# Patient Record
Sex: Female | Born: 1990 | Race: Black or African American | Hispanic: No | Marital: Single
Health system: Midwestern US, Community
[De-identification: ages and names within clinical notes are randomized; demographics above are authoritative.]

## PROBLEM LIST (undated history)

## (undated) DIAGNOSIS — O9989 Other specified diseases and conditions complicating pregnancy, childbirth and the puerperium: Secondary | ICD-10-CM

## (undated) DIAGNOSIS — O4690 Antepartum hemorrhage, unspecified, unspecified trimester: Secondary | ICD-10-CM

---

## 2012-08-17 LAB — RPR
RPR, EXTERNAL: NONREACTIVE
RPR, External: NONREACTIVE

## 2012-08-17 LAB — HIV-1 AB WESTERN BLOT CONFIRM ONLY
HIV, EXTERNAL: NONREACTIVE
HIV, External: NONREACTIVE

## 2012-08-17 LAB — BLOOD TYPE, (ABO+RH)
ABO,Rh: O NEG
TYPE, ABO & RH, EXTERNAL: O NEG

## 2012-08-17 LAB — N GONORRHOEAE, DNA PROBE: Gonorrhea, External: NEGATIVE

## 2012-08-17 LAB — ANTIBODY SCREEN: Antibody screen, External: NEGATIVE

## 2012-08-17 LAB — HEPATITIS B SURFACE AG W/REFLEX: HBsAg, External: NEGATIVE

## 2012-08-17 LAB — RUBELLA AB, IGM: Rubella, External: IMMUNE

## 2012-08-17 LAB — CHLAMYDIA DNA PROBE: Chlamydia, External: NEGATIVE

## 2012-11-19 ENCOUNTER — Inpatient Hospital Stay: Payer: MEDICAID

## 2012-11-19 LAB — URINALYSIS W/ REFLEX CULTURE
Bacteria: NEGATIVE /hpf
Bilirubin: NEGATIVE
Blood: NEGATIVE
Glucose: NEGATIVE mg/dL
Ketone: NEGATIVE mg/dL
Leukocyte Esterase: NEGATIVE
Nitrites: NEGATIVE
Protein: NEGATIVE mg/dL
Specific gravity: 1.02 (ref 1.003–1.030)
Urobilinogen: 0.2 EU/dL (ref 0.2–1.0)
pH (UA): 7 (ref 5.0–8.0)

## 2012-11-19 LAB — CBC W/O DIFF
HCT: 32.6 % — ABNORMAL LOW (ref 35.0–47.0)
HGB: 10.5 g/dL — ABNORMAL LOW (ref 11.5–16.0)
MCH: 28 PG (ref 26.0–34.0)
MCHC: 32.2 g/dL (ref 30.0–36.5)
MCV: 86.9 FL (ref 80.0–99.0)
PLATELET: 177 10*3/uL (ref 150–400)
RBC: 3.75 M/uL — ABNORMAL LOW (ref 3.80–5.20)
RDW: 13.4 % (ref 11.5–14.5)
WBC: 11.2 10*3/uL — ABNORMAL HIGH (ref 3.6–11.0)

## 2012-11-19 LAB — METABOLIC PANEL, COMPREHENSIVE
A-G Ratio: 1 — ABNORMAL LOW (ref 1.1–2.2)
ALT (SGPT): 27 U/L (ref 12–78)
AST (SGOT): 18 U/L (ref 15–37)
Albumin: 3.2 g/dL — ABNORMAL LOW (ref 3.5–5.0)
Alk. phosphatase: 62 U/L (ref 50–136)
Anion gap: 4 mmol/L — ABNORMAL LOW (ref 5–15)
BUN/Creatinine ratio: 12 (ref 12–20)
BUN: 6 MG/DL (ref 6–20)
Bilirubin, total: 0.2 MG/DL (ref 0.2–1.0)
CO2: 26 mmol/L (ref 21–32)
Calcium: 8.9 MG/DL (ref 8.5–10.1)
Chloride: 108 mmol/L (ref 97–108)
Creatinine: 0.52 MG/DL (ref 0.45–1.15)
GFR est AA: >60 mL/min/{1.73_m2} (ref >60)
GFR est non-AA: >60 mL/min/{1.73_m2} (ref >60)
Globulin: 3.3 g/dL (ref 2.0–4.0)
Glucose: 109 mg/dL — ABNORMAL HIGH (ref 65–100)
Potassium: 3.9 mmol/L (ref 3.5–5.1)
Protein, total: 6.5 g/dL (ref 6.4–8.2)
Sodium: 138 mmol/L (ref 136–145)

## 2012-11-19 MED ADMIN — ondansetron (ZOFRAN) injection 4 mg: INTRAVENOUS | @ 19:00:00 | NDC 00409475503

## 2012-11-19 MED ADMIN — dextrose 5% lactated ringers infusion: INTRAVENOUS | @ 19:00:00 | NDC 00338012504

## 2012-11-19 MED ADMIN — promethazine (PHENERGAN) 12.5 mg in 0.9% sodium chloride 50 mL IVPB: INTRAVENOUS | @ 21:00:00 | NDC 00641092821

## 2012-11-19 MED ADMIN — sodium chloride (NS) 0.9 % flush: @ 19:00:00 | NDC 87701099893

## 2012-11-19 MED ADMIN — famotidine (PF) (PEPCID) injection 20 mg: INTRAVENOUS | @ 19:00:00 | NDC 63323073912

## 2012-11-19 MED ADMIN — dextrose 5% lactated ringers infusion: INTRAVENOUS | @ 19:00:00 | NDC 00409792909

## 2012-11-19 NOTE — Progress Notes (Addendum)
1331-pt here from ER with nausea and hx of vomitting/diarrhea (last time this am), pt reports abdominal and back aches and feels baby moving like normal  1350-pt resting in bed  1400-called Dr Thalia Bloodgood to alert her of pts arrival and status

## 2012-11-19 NOTE — H&P (Signed)
History & Physical    Name: Sharon Simmons MRN: [id number]  SSN: [id number]    Date of Birth: Jan 04, 1991  Age: 22 y.o.  Sex: female      Subjective:     Reason for Admission:  Pregnancy and N/V/D    History of Present Illness: Sharon Simmons is a 22 y.o. African American female with an estimated gestational age of [redacted]w[redacted]d with Estimated Date of Delivery: 03/23/13. Patient complains of nausea/vomiting and diarrhea that started last pm. for 1 days. 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 . Patient denies contractions, vaginal bleeding  and vaginal leaking of fluid . Reports fetal movement.     OB History    Grav Para Term Preterm Abortions TAB SAB Ect Mult Living    1                # Outc Date GA Lbr Len/2nd Wgt Sex Del Anes PTL Lv    1 CUR                 Past Medical History   Diagnosis Date   ??? Anemia      hx of   ??? Psychiatric problem      anxiety, no meds     Past Surgical History   Procedure Laterality Date   ??? Hx other surgical  2012     wisdom teeth     Social History     Occupational History   ??? Not on file.     Social History Main Topics   ??? Smoking status: Former Smoker     Quit date: 06/13/2012   ??? Smokeless tobacco: Not on file   ??? Alcohol Use: No   ??? Drug Use: No   ??? Sexually Active: Yes -- Female partner(s)     Birth Control/ Protection: Implant      History reviewed. No pertinent family history.    No Known Allergies  Prior to Admission medications    Medication Sig Start Date End Date Taking? Authorizing Provider   PRENATAL VIT/IRON FUMARATE/FA (PRENATAL PO) Take  by mouth.   Yes Historical Provider        Review of Systems:  Pertinent items are noted in the History of Present Illness.     Objective:     Vitals:    Filed Vitals:    11/19/12 1338 11/19/12 1344   BP: 116/65    Pulse: 79    Temp: 98 ??F (36.7 ??C)    Resp: 12    Height:  5\' 8"  (1.727 m)   Weight:  74.39 kg (164 lb)      Temp (24hrs), Avg:98 ??F (36.7 ??C), Min:98 ??F (36.7 ??C), Max:98 ??F (36.7 ??C)    BP  Min:  116/65  Max: 116/65     Physical Exam:  Patient without distress.  Heart: Regular rate and rhythm  Lung: clear to auscultation throughout lung fields, no wheezes, no rales, no rhonchi and normal respiratory effort  Abdomen: soft, nontender, gravid  Lower Extremities:  - Edema No     Membranes:  Intact  Uterine Activity:  None  Fetal Heart Rate:  +fht's by doppler       Lab/Data Review:  No results found for this or any previous visit (from the past 24 hour(s)).    Assessment and Plan:     22 y/o G1 at 13 2/7weeks here for n/v/d-likely viral gastroenteritis. Will send cmp, cbc and ua. Start ivf's/zofran/famotidine. Re-evaluate  after labs return.     Signed By:  Gabriel Earing, DO     November 19, 2012

## 2012-11-19 NOTE — Progress Notes (Signed)
High Risk Obstetrics Progress Note    Name: Sharon Simmons MRN: [id number]  SSN: [id number]    Date of Birth: 10/08/1990  Age: 22 y.o.  Sex: female      Subjective:      LOS: 0 days    Estimated Date of Delivery: 03/23/13   Gestational Age Today: [redacted]w[redacted]d     Patient admitted for n/v/d. States she does have mild left round ligament discomfort and does not have any further vomiting or diarrhea. She has tolerated crackers and water. Ready to go home. .    Objective:     Vitals:  Blood pressure 116/65, pulse 79, temperature 98 ??F (36.7 ??C), resp. rate 12, height 5\' 8"  (1.727 m), weight 74.39 kg (164 lb).   Temp (24hrs), Avg:98 ??F (36.7 ??C), Min:98 ??F (36.7 ??C), Max:98 ??F (36.7 ??C)    Systolic (24hrs), Avg:116 mmHg, Min:116 mmHg, Max:116 mmHg     Diastolic (24hrs), Avg:65 mmHg, Min:65 mmHg, Max:65 mmHg       Intake and Output:          Physical Exam:  Patient without distress.       Membranes:  Intact    Uterine Activity:  None         Labs:   Recent Results (from the past 36 hour(s))   URINALYSIS W/ REFLEX CULTURE    Collection Time     11/19/12  2:48 PM       Result Value Range    Color YELLOW/STRAW      Appearance CLOUDY (*) CLEAR    Specific gravity 1.020  1.003 - 1.030      pH (UA) 7.0  5.0 - 8.0      Protein NEGATIVE   NEGATIVE mg/dL    Glucose NEGATIVE   NEGATIVE mg/dL    Ketone NEGATIVE   NEGATIVE mg/dL    Bilirubin NEGATIVE   NEGATIVE    Blood NEGATIVE   NEGATIVE    Urobilinogen 0.2  0.2 - 1.0 EU/dL    Nitrites NEGATIVE   NEGATIVE    Leukocyte Esterase NEGATIVE   NEGATIVE    UA:UC IF INDICATED CULTURE NOT INDICATED BY UA RESULT  CULTURE NOT INDICATE    WBC 0-4  0 - 4 /hpf    RBC 0-5  0 - 5 /hpf    Epithelial cells FEW  FEW /lpf    Bacteria NEGATIVE   NEGATIVE /hpf    Hyaline Cast 0-2  0 - 2   CBC W/O DIFF    Collection Time     11/19/12  3:00 PM       Result Value Range    WBC 11.2 (*) 3.6 - 11.0 K/uL    RBC 3.75 (*) 3.80 - 5.20 M/uL    HGB 10.5 (*) 11.5 - 16.0 g/dL    HCT 13.0 (*) 86.5 - 47.0 %    MCV 86.9   80.0 - 99.0 FL    MCH 28.0  26.0 - 34.0 PG    MCHC 32.2  30.0 - 36.5 g/dL    RDW 78.4  69.6 - 29.5 %    PLATELET 177  150 - 400 K/uL   METABOLIC PANEL, COMPREHENSIVE    Collection Time     11/19/12  3:00 PM       Result Value Range    Sodium 138  136 - 145 mmol/L    Potassium 3.9  3.5 - 5.1 mmol/L    Chloride 108  97 - 108 mmol/L  CO2 26  21 - 32 mmol/L    Anion gap 4 (*) 5 - 15 mmol/L    Glucose 109 (*) 65 - 100 mg/dL    BUN 6  6 - 20 MG/DL    Creatinine 1.61  0.96 - 1.15 MG/DL    BUN/Creatinine ratio 12  12 - 20      GFR est AA >60  >60 ml/min/1.64m2    GFR est non-AA >60  >60 ml/min/1.58m2    Calcium 8.9  8.5 - 10.1 MG/DL    Bilirubin, total 0.2  0.2 - 1.0 MG/DL    ALT 27  12 - 78 U/L    AST 18  15 - 37 U/L    Alk. phosphatase 62  50 - 136 U/L    Protein, total 6.5  6.4 - 8.2 g/dL    Albumin 3.2 (*) 3.5 - 5.0 g/dL    Globulin 3.3  2.0 - 4.0 g/dL    A-G Ratio 1.0 (*) 1.1 - 2.2         Assessment and Plan:      Active Problems:    * No active hospital problems. *     22 y/o G1 with gastroenteritis -resolving. Tolerating po. Will discharge home with script for phenergan. Keep follow up appt as scheduled or sooner prn.     Signed By: Gabriel Earing, DO     November 19, 2012

## 2012-11-19 NOTE — Progress Notes (Addendum)
1430 Spoke to Dr. Thalia Bloodgood on phone.  Updated her on HPI, VS, FHTs, and uterine irritability (10-20 secs duration).  Orders for 500 ml D5LR bolus followed by 125 ml/hr x 1 liter, cbc, cmp, ua, and to call her when lab results are available.  1455 Dr. Thalia Bloodgood at bedside, discussing HPI with pt, performing PE.  Updated that pt had an episode of emesis after two sips of ginger ale and that pt would like something through her IV for heartburn.  Orders received for IV Pepcid.  Will call to updated with lab results.  Plan to d/c pt home with antiemetics once tolerating PO fluids.  1610 Called Dr. Thalia Bloodgood to update her on lab results and that patient continues to have episode of emesis (despite Zofran) after having only one sip of ginger ale and 2 small bites of applesauce.  States she will call right back.  1634 Spoke to Dr. Thalia Bloodgood on phone.  Updated her on lab results and continued episodes of emesis.  Orders to continue fluids, give a dose of phenergan, and she will be by to see the patient.  1654 Phenergan hung, pt trying applesauce now at her own discretion.  1720 Pt finished 8 graham crackers, applesauce, and a can of ginger ale.  Asking for more to eat, states she is feeling better and she is hungry.  Also states that she is having some stomach pain and points to her left lower abdomen/groin.  Toco adj.  Pt taken graham crackers, peanut butter, and apple juice.  1750 Spoke to Dr. Thalia Bloodgood, updated her that pt was tolerating PO fluids, crackers, and applesauce.  States she will be in to see pt.  1752 Pt taken a pitcher of water and encouraged to PO hydrate in anticipation of d/c home.  1800 Dr. Thalia Bloodgood at beside assessing and discussing plan of care with pt.  1845 Discharge instructions given including prescription for phenergan, pt verbalized understanding, pt d/c home.

## 2013-02-05 ENCOUNTER — Inpatient Hospital Stay: Payer: MEDICAID

## 2013-02-05 LAB — URINALYSIS W/ REFLEX CULTURE
Bacteria: NEGATIVE /hpf
Bilirubin: NEGATIVE
Blood: NEGATIVE
Glucose: NEGATIVE mg/dL
Ketone: NEGATIVE mg/dL
Leukocyte Esterase: NEGATIVE
Nitrites: NEGATIVE
Protein: NEGATIVE mg/dL
Specific gravity: 1.014 (ref 1.003–1.030)
Urobilinogen: 1 EU/dL (ref 0.2–1.0)
pH (UA): 7 (ref 5.0–8.0)

## 2013-02-05 NOTE — Progress Notes (Addendum)
1618: Dr Thalia Bloodgood called to inform her of patient arrival. S  1619: Called Dr. Romeo Rabon to see if she is covering   1622: Dr Shela Nevin paged.

## 2013-02-05 NOTE — H&P (Signed)
History & Physical    Name: Sharon Simmons MRN: [id number]  SSN: [id number]    Date of Birth: 22-Dec-1990  Age: 22 y.o.  Sex: female        Subjective:pelvic pain     Estimated Date of Delivery: 03/23/13  OB History    Grav Para Term Preterm Abortions TAB SAB Ect Mult Living    1                   Ms. Sharon Simmons is admitted with pregnancy at [redacted]w[redacted]d for pelvic pain. Prenatal course was normal. Please see prenatal records for details.  Pt describes pain as a sharp pain near groin/vagina which occurs with position changes or lifting leg, etc.  When questioned further, she does have some mild menstrual-like cramping.  No unusual activity recently. No VB or LOF.  No recent IC.  No urinary sxs    Past Medical History   Diagnosis Date   ??? Psychiatric problem      anxiety, no meds   ??? Anemia    also Wolff-Parkinson-White Syndrome    Past Surgical History   Procedure Laterality Date   ??? Hx other surgical  2012     wisdom teeth     Social History     Occupational History   ??? Not on file.     Social History Main Topics   ??? Smoking status: Former Smoker     Quit date: 06/13/2012   ??? Smokeless tobacco: Not on file   ??? Alcohol Use: No   ??? Drug Use: No   ??? Sexually Active: Yes -- Female partner(s)     Birth Control/ Protection: Implant     History reviewed. No pertinent family history.    No Known Allergies  Prior to Admission medications    Medication Sig Start Date End Date Taking? Authorizing Provider   PRENATAL VIT/IRON FUMARATE/FA (PRENATAL PO) Take  by mouth.   Yes Historical Provider        Review of Systems: Pertinent items are noted in HPI.    Objective:     Vitals:  Filed Vitals:    02/05/13 1552 02/05/13 1553 02/05/13 1631   BP: 119/63  116/70   Pulse: 79  82   Temp: 98.3 ??F (36.8 ??C)     Resp: 16     Height:  5\' 8"  (1.727 m)    Weight:  83.008 kg (183 lb)         Physical Exam:  Patient without distress.  Abdomen: soft, nontender  Fundus: soft and non tender  Perineum: blood absent, amniotic fluid absent  Cervical Exam: 0  cm dilated    0% effaced    -2 station    Presenting Part: cephalic  Lower Extremities:  - Edema No  Membranes:  Intact  Fetal Heart Rate: Reactive  Uterine contractions: irregular, rare  Uterine irritability: yes    Prenatal Labs:   No results found for this basename: OBEXTABORH, OBExtAbscrn, OBExtRubella, OBExtGrBS, OBExtHBsAg, OBExtHIV, OBExtRPR, OBExtGonorr, OBExtChlam        Assessment/Plan:     Plan: No cervical change or evidence of labor. Some mild uterine irritability and musculoskeletal pain.  Precautions discussed.  Keep appt in office next week or call prn.    Signed By:  Marcia Brash, MD     February 05, 2013

## 2013-02-05 NOTE — Progress Notes (Addendum)
1610. Bedside report received from e. Konrad Dolores, rn.  [phone]. Dr. Shela Nevin on phone. Dr. Shela Nevin given pt update of pelvic pressure/pain, uterine iritability, and a couple of contractions since on the monitor. Dr. Shela Nevin will come evaluate patient. PO hydrate in meantime.  1740. SVE by Dr. Shela Nevin. Pt cervix closed. Dr. Shela Nevin discharging patient.  1815. Pt discharged home. Pt signed discharge orders and states understanding. Pt given paper copy of instructions and understands when to call for immediate care. Pt will follow up on 02/18/13 at 3:15pm for routine appointment.

## 2013-02-25 LAB — GYN RAPID GP B STREP: GrBStrep, External: NEGATIVE

## 2013-02-26 ENCOUNTER — Inpatient Hospital Stay: Payer: MEDICAID

## 2013-02-26 LAB — URINALYSIS W/ REFLEX CULTURE
Bacteria: NEGATIVE /hpf
Bilirubin: NEGATIVE
Blood: NEGATIVE
Glucose: NEGATIVE mg/dL
Ketone: NEGATIVE mg/dL
Leukocyte Esterase: NEGATIVE
Nitrites: NEGATIVE
Protein: NEGATIVE mg/dL
Specific gravity: 1.007 (ref 1.003–1.030)
Urobilinogen: 0.2 EU/dL (ref 0.2–1.0)
pH (UA): 6.5 (ref 5.0–8.0)

## 2013-02-26 NOTE — Progress Notes (Addendum)
1731: Patient arrived to LDR 12 via wheelchair from ED with c/o bleeding. Patient states she began with "bright read bleeding" around 1pm and passing small "dimed-sized" clots around 2pm.  1748: Called Dr Montel Culver and informed her of patients arrival and complaints. Dr Montel Culver states she reviewed the patient's tracing on [employer]. Orders received for PO hydration and SVE.   1843: UA sent.  1940: Bedside and Verbal shift change report given to Luvenia Heller, RNC (oncoming nurse) by Delton See, RN (offgoing nurse). Report given with SBAR, Intake/Output and Recent Results.

## 2013-02-26 NOTE — H&P (Signed)
History & Physical    Name: Sharon Simmons MRN: [id number]  SSN: [id number]    Date of Birth: [birth year]  Age: 22 y.o.  Sex: female        Subjective:     Estimated Date of Delivery: 03/23/13  OB History    Grav Para Term Preterm Abortions TAB SAB Ect Mult Living    1                # Outc Date GA Lbr Len/2nd Wgt Sex Del Anes PTL Lv    1 CUR                   Ms. Jonas is admitted with pregnancy at [redacted]w[redacted]d for c/o VB and ctx. Prenatal course was complicated by maternal Wolff-Parkinson-White Syndrome and Rh neg s/p Rhogam. Please see prenatal records for details.  Pt was seen in office yesterday with SVE by Dr. Thalia Bloodgood 1/50.    Past Medical History   Diagnosis Date   ??? Psychiatric problem      anxiety, no meds   ??? Heart abnormality      Wolff-Parkinson White Syndrome   ??? 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 Anemia      Past Surgical History   Procedure Laterality Date   ??? Hx other surgical  2012     wisdom teeth   ??? Hx heart catheterization  2012     d/t Evelene Croon Parkinson White Syndrome     Social History     Occupational History   ??? Not on file.     Social History Main Topics   ??? Smoking status: Former Smoker     Quit date: 06/13/2012   ??? Smokeless tobacco: Not on file   ??? Alcohol Use: No   ??? Drug Use: No   ??? Sexually Active: Yes -- Female partner(s)     Birth Control/ Protection: Implant     Family History   Problem Relation Age of Onset   ??? Alcohol abuse Neg Hx    ??? Arthritis-osteo Neg Hx    ??? Asthma Neg Hx    ??? Bleeding Prob Neg Hx    ??? Cancer Neg Hx    ??? Diabetes Neg Hx    ??? Elevated Lipids Neg Hx    ??? Headache Neg Hx    ??? Heart Disease Neg Hx    ??? Hypertension Neg Hx    ??? Migraines Neg Hx    ??? Lung Disease Neg Hx    ??? Psychiatric Disorder Neg Hx    ??? Stroke Neg Hx    ??? Mental Retardation Neg Hx        No Known Allergies  Prior to Admission medications    Medication Sig Start Date End Date Taking? Authorizing Provider   PRENATAL VIT/IRON FUMARATE/FA (PRENATAL PO) Take  by  mouth.    Historical Provider        Review of Systems: A comprehensive review of systems was negative except for that written in the HPI.    Objective:     Vitals:  Filed Vitals:    02/26/13 1740   Height: 5\' 8"  (1.727 m)   Weight: 86.637 kg (191 lb)        Physical Exam:  Patient without distress.  Membranes:  Intact  Fetal Heart Rate: Reactive   SVE (per RN): 1/40    Prenatal Labs:   Lab Results   Component Value Date/Time  ABO,Rh O negative 08/17/2012    Rubella immune 08/17/2012    HBsAg negative 08/17/2012    HIV non reactive 08/17/2012    RPR non reactive 08/17/2012    Gonorrhea negative 08/17/2012    Chlamydia negative 08/17/2012         Assessment/Plan:     Plan: Pt still having occ ctx on toco but more comfortable s/p PO hydration. Has eaten since she came in. No VB seen, even with SVE. Reviewed tips for comfort - PO hydration, therapeutic rest with Tylenol and Benadryl to help her sleep tonight.  D/c home. F/up as scheduled with Dr. Thalia Bloodgood.  Precautions reviewed.    Signed By:  Otho Ket, MD     February 26, 2013

## 2013-03-17 ENCOUNTER — Inpatient Hospital Stay
Admit: 2013-03-17 | Discharge: 2013-03-20 | Disposition: A | Discharge status: Home / Self Care | Payer: MEDICAID | Attending: Obstetrics & Gynecology | Admitting: Obstetrics & Gynecology

## 2013-03-17 LAB — CBC WITH AUTOMATED DIFF
ABS. BASOPHILS: 0 10*3/uL (ref 0.0–0.1)
ABS. EOSINOPHILS: 0.1 10*3/uL (ref 0.0–0.4)
ABS. LYMPHOCYTES: 1.2 10*3/uL (ref 0.8–3.5)
ABS. MONOCYTES: 0.8 10*3/uL (ref 0.0–1.0)
ABS. NEUTROPHILS: 7.4 10*3/uL (ref 1.8–8.0)
BASOPHILS: 0 % (ref 0–1)
EOSINOPHILS: 1 % (ref 0–7)
HCT: 33.6 % — ABNORMAL LOW (ref 35.0–47.0)
HGB: 11.1 g/dL — ABNORMAL LOW (ref 11.5–16.0)
LYMPHOCYTES: 13 % (ref 12–49)
MCH: 26 PG (ref 26.0–34.0)
MCHC: 33 g/dL (ref 30.0–36.5)
MCV: 78.7 FL — ABNORMAL LOW (ref 80.0–99.0)
MONOCYTES: 8 % (ref 5–13)
NEUTROPHILS: 78 % — ABNORMAL HIGH (ref 32–75)
PLATELET: 184 10*3/uL (ref 150–400)
RBC: 4.27 M/uL (ref 3.80–5.20)
RDW: 16.4 % — ABNORMAL HIGH (ref 11.5–14.5)
WBC: 9.5 10*3/uL (ref 3.6–11.0)

## 2013-03-17 LAB — CBC W/O DIFF
HCT: 33.8 % — ABNORMAL LOW (ref 35.0–47.0)
HGB: 11 g/dL — ABNORMAL LOW (ref 11.5–16.0)
MCH: 25.7 PG — ABNORMAL LOW (ref 26.0–34.0)
MCHC: 32.5 g/dL (ref 30.0–36.5)
MCV: 79 FL — ABNORMAL LOW (ref 80.0–99.0)
PLATELET: 180 10*3/uL (ref 150–400)
RBC: 4.28 M/uL (ref 3.80–5.20)
RDW: 16.1 % — ABNORMAL HIGH (ref 11.5–14.5)
WBC: 9.6 10*3/uL (ref 3.6–11.0)

## 2013-03-17 MED ADMIN — fentaNYL-bupivacaine in NS(PF) 2-0.125 mcg/mL-% epidural infusion: EPIDURAL | @ 19:00:00 | NDC 61553012448

## 2013-03-17 MED ADMIN — bupivacaine (PF) (MARCAINE) 0.5 % (5 mg/mL) injection: EPIDURAL | @ 19:00:00 | NDC 63323046637

## 2013-03-17 MED ADMIN — lactated ringers infusion: INTRAVENOUS | @ 18:00:00 | NDC 00409795309

## 2013-03-17 MED ADMIN — fentanyl citrate (PF) injection: EPIDURAL | @ 19:00:00 | NDC 00409909332

## 2013-03-17 MED ADMIN — lactated ringers infusion: INTRAVENOUS | @ 22:00:00 | NDC 00409795309

## 2013-03-17 MED ADMIN — lactated ringers bolus infusion 500 mL: INTRAVENOUS | @ 17:00:00 | NDC 00409795309

## 2013-03-17 MED ADMIN — lactated ringers infusion: INTRAVENOUS | @ 23:00:00 | NDC 00409795309

## 2013-03-17 MED ADMIN — lactated ringers infusion: INTRAVENOUS | @ 20:00:00 | NDC 00409795309

## 2013-03-17 MED ADMIN — terbutaline (BRETHINE) 1 mg/mL injection: SUBCUTANEOUS | @ 20:00:00 | NDC 63323066501

## 2013-03-17 NOTE — Anesthesia Procedure Notes (Signed)
Epidural Block    Staffing  Anesthesiologist: Lydia Meng  Additional Notes  Labor Epidural catheter placement Procedure Note    Epidural analgesia requested.  All questions answered regarding epidural placement.  Patient wishes to proceed.  "Time-out" performed  Patient was placed in the seated position.   The back was prepped and draped at the lumbar region.   Lidocaine 1% x 2ml was injected locally at ~L3 - L4.  A 17G Tuohy needle was placed at the above level.   Loss of resistance was obtained, no CSF or paresthesia was apparent.   A 19 G epidural catheter was placed and secured at ~7 cm at the skin.   Aspiration was negative. No paresthesias noted.  A test dose of local anesthetic as charted  was negative for heart rate change, tinnitus, metallic taste or mental status changes.   Catheter was secured with Tegaderm and tape.   Constant infusion w/ patient controlled bolus as ordered was started by RN.  No apparent complications.  Vital signs were stable and the patient reported relief.

## 2013-03-17 NOTE — Progress Notes (Signed)
Labor Progress Note  Patient seen, fetal heart rate and contraction pattern evaluated, patient examined. Pt feeling more rectal pressure.  No data found.      Physical Exam:  Cervical Exam:  6/90 %/0/   Uterine Activity: Frequency: Every 1.5-2 minutes  Fetal Heart Rate: Baseline: 130 per minute  Variability: moderate  Decelerations: variable    Assessment/Plan:  Labor  Not progressing normally  continue pitocin augmentation  titrating dosage safely, Continue plan for vaginal delivery. Small amount of change since last exam. Discussed with pt her course of labor. Will allow for one more hour of adequate contractions and if no change, will proceed with c-section.     Lance Morin, DO

## 2013-03-17 NOTE — Progress Notes (Signed)
Labor Progress Note  Patient seen, fetal heart rate and contraction pattern evaluated, patient examined.  Patient Vitals for the past 1 hrs:   BP Pulse   03/17/13 1548 113/60 mmHg 59       Physical Exam:  Cervical Exam:  4/80/-1  Membranes:  Prior ROM  Uterine Activity: Tachysystole  Fetal Heart Rate: 130s minimal - moderate, no accels, 2 minute decel to 70s with cervical exam with recorvery back to baseline with variability    Assessment/Plan:  Latent labor - not progressing.  Terbutaline administered for dysfunctional/tachysystole ctx pattern.  FHT improved.

## 2013-03-17 NOTE — Progress Notes (Signed)
Labor Progress Note  Patient seen, fetal heart rate and contraction pattern evaluated, patient examined.  Patient Vitals for the past 1 hrs:   BP Pulse   03/17/13 1502 123/65 mmHg 67   03/17/13 1456 124/64 mmHg 70   03/17/13 1451 121/56 mmHg 77   03/17/13 1446 128/75 mmHg 104   03/17/13 1445 130/94 mmHg 109       Physical Exam:  Cervical Exam:  4/80/-1 FSE & IUPC placed  Membranes:  Prior AROM  Uterine Activity: CTX every 2  Fetal Heart Rate: 150s min-mod no accels no decels    Assessment/Plan:  [redacted]w[redacted]d latent labor - now has epidural.  FSE & IUPC placed for closer monitoring.  FWB reassuring however still not reactive.  Continue expectant management at this time.

## 2013-03-17 NOTE — Progress Notes (Signed)
Labor Progress Note  Patient seen, fetal heart rate and contraction pattern evaluated, patient examined.  C/o CTX increasing - requests epidural.   Patient Vitals for the past 1 hrs:   BP Pulse   03/17/13 1352 127/65 mmHg 74       Physical Exam:  Cervical Exam:  4/90/-1  Membranes:  Incidental AROM at time of cervical exam - clear  Uterine Activity: CTX every 2-3  Fetal Heart Rate: 150s min-mod, no 15x15 accels, no decel    Assessment/Plan:  G1P0 at [redacted]w[redacted]d in latent labor.  Epidural.  FWB reassuring but yet to be reactive.  GBS neg.

## 2013-03-17 NOTE — Progress Notes (Signed)
Labor Progress Note  Patient seen, fetal heart rate and contraction pattern evaluated, patient examined. Pt still comfortable with epidural.  Patient Vitals for the past 2 hrs:   BP Temp   03/17/13 2143 117/61 mmHg 97.9 ??F (36.6 ??C)       Physical Exam:  Cervical Exam:  6-7/90 %/0/  (minimal change)  Uterine Activity: Frequency: Every 1.5-2 minutes and Intensity: strong  Fetal Heart Rate: Baseline: 150 per minute  Variability: moderate  Decelerations: variable    Assessment/Plan:  Labor not progressing. Only approximately 2cm of change over the past 7 hours. Now with rise in baseline to 150s. Still with frequent contractions and unable to tolerate an increase in Pitocin. Proceed with Cesarean Section Reassuring fetal status with labor not progressing normally, findings consistent with failure of dilatation.  Recommended proceeding with cesarean delivery.  Risks of bleeding, infection, bladder and bowel damage explained to patient and father of baby.  They understand the situation and consent to the cesarean delivery. 2gm of Ancef to be given preop. DVT prophylaxis.          Lance Morin, DO

## 2013-03-17 NOTE — Progress Notes (Addendum)
03/17/2013 12:52 PM for uterine contractions and rule out SROM     1325 Dr. Margaretha Sheffield notified of TOCO and FHR tracing post giving juice. Ok to give bolus     1750 Discussed FHR and TOCO tracing with Dr. Romeo Rabon and [facility] nurse Darla. Discussed possibility of augmenting with pitocin to enhance MVU's and encourage consistent pattern. Will continue to monitor for now and give pt more time with Dr. Romeo Rabon to re-evaluate.    1930 Bedside and Verbal shift change report given to C. DeChristopher RN  (Cabin crew) by R.Hagler RN  Physiological scientist). Report included the following information SBAR, Procedure Summary, Intake/Output, MAR, Accordion and Recent Results.

## 2013-03-17 NOTE — Anesthesia Pre-Procedure Evaluation (Addendum)
Anesthetic History   No history of anesthetic complications           Review of Systems / Medical History  Patient summary reviewed, nursing notes reviewed and pertinent labs reviewed    Pulmonary  Within defined limits               Neuro/Psych   Within defined limits           Cardiovascular  Within defined limits                   GI/Hepatic/Renal  Within defined limits                Endo/Other  Within defined limits           Other Findings            Physical Exam    Airway  Mallampati: II  TM Distance: > 6 cm  Neck ROM: normal range of motion   Mouth opening: Normal     Cardiovascular  Regular rate and rhythm,  S1 and S2 normal,  no murmur, click, rub, or gallop             Dental  No notable dental hx       Pulmonary  Breath sounds clear to auscultation               Abdominal  GI exam deferred       Other Findings            Anesthetic Plan    ASA: 2  Anesthesia type: epidural          Induction: Intravenous  Anesthetic plan and risks discussed with: Patient

## 2013-03-17 NOTE — H&P (Addendum)
History & Physical    Name: Sharon Simmons MRN: [id number]  SSN: [id number]    Date of Birth: 17-Oct-1990  Age: 22 y.o.  Sex: female        Subjective:     Estimated Date of Delivery: 03/23/13  OB History    Grav Para Term Preterm Abortions TAB SAB Ect Mult Living    1                # Outc Date GA Lbr Len/2nd Wgt Sex Del Anes PTL Lv    1 CUR                   Sharon Simmons is admitted with pregnancy at [redacted]w[redacted]d for early labor.  Pt reports contractions since last night.  Possible leakage of clear fluid today.  No VB. Prenatal course was notable for h/o Wolf-Parkinson-White syndrome s/p normal cards eval.  Rh negative s/p rhogam.  Received tdap. Please see prenatal records for details.    Past Medical History   Diagnosis Date   ??? Psychiatric problem      anxiety, no meds   ??? Heart abnormality      Wolff-Parkinson White Syndrome   ??? 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 Anemia      Past Surgical History   Procedure Laterality Date   ??? Hx other surgical  2012     wisdom teeth   ??? Hx heart catheterization  2012     d/t Sharon Simmons Parkinson White Syndrome     Social History     Occupational History   ??? Not on file.     Social History Main Topics   ??? Smoking status: Former Smoker     Quit date: 06/13/2012   ??? Smokeless tobacco: Not on file   ??? Alcohol Use: No   ??? Drug Use: No   ??? Sexually Active: Yes -- Female partner(s)     Birth Control/ Protection: Implant     Family History   Problem Relation Age of Onset   ??? Alcohol abuse Neg Hx    ??? Arthritis-osteo Neg Hx    ??? Asthma Neg Hx    ??? Bleeding Prob Neg Hx    ??? Cancer Neg Hx    ??? Diabetes Neg Hx    ??? Elevated Lipids Neg Hx    ??? Headache Neg Hx    ??? Heart Disease Neg Hx    ??? Hypertension Neg Hx    ??? Migraines Neg Hx    ??? Lung Disease Neg Hx    ??? Psychiatric Disorder Neg Hx    ??? Stroke Neg Hx    ??? Mental Retardation Neg Hx        No Known Allergies  Prior to Admission medications    Medication Sig Start Date End Date Taking? Authorizing Provider    PRENATAL VIT/IRON FUMARATE/FA (PRENATAL PO) Take  by mouth.    Historical Provider        Review of Systems: A comprehensive review of systems was negative except for that written in the HPI.    Objective:     Vitals:  141/80, 127/65, HR 73, afebrile    Physical Exam:  Patient without distress.  Heart: Regular rate and rhythm  Lung: clear to auscultation throughout lung fields, no wheezes, no rales, no rhonchi and normal respiratory effort  Abdomen: soft, nontender  Fundus: soft and non tender, EFW 6.5#  Perineum: blood absent,  amniotic fluid absent  Cervical Exam: 3/80/-1 vertex  Lower Extremities:  - Edema No  Membranes:    Fetal Heart Rate: 150s minimal, no accels, no decels  Toco: CTX every 6 minutes    Prenatal Labs:   Lab Results   Component Value Date/Time    ABO,Rh O negative 08/17/2012    Rubella immune 08/17/2012    HBsAg negative 08/17/2012    HIV non reactive 08/17/2012    RPR non reactive 08/17/2012    Gonorrhea negative 08/17/2012    Chlamydia negative 08/17/2012   GBS negative (02/25/13)    Nitrazine equivocal    Assessment/Plan:     Plan: Admit for early labor.  Reassuring FHT - category II.  Will encourage ambulation once reactive NST.  Group B Strep was negative.    Signed By:  Geri Seminole, MD     March 17, 2013

## 2013-03-17 NOTE — Progress Notes (Signed)
Labor Progress Note  Patient seen, fetal heart rate and contraction pattern evaluated, patient examined. Pt now having variable decels. Pitocin was titrated up to 4 units.  Patient Vitals for the past 2 hrs:   BP Temp Pulse Resp   03/17/13 1959 126/78 mmHg 98.4 ??F (36.9 ??C) 62 16       Physical Exam:  Cervical Exam:  4 (by Dr.Golish)/90 %/0/  (exam 4-5 cm- minimal change)  Uterine Activity: Frequency: Every 1.5-2 minutes  Fetal Heart Rate: Baseline: 130 per minute  Variability: moderate  Decelerations: variable    Assessment/Plan:  Reassuring fetal status, Labor  Not progressing normally  continue pitocin augmentation  titrating dosage safely. Suspect OP presentation of the fetal head with Leopolds. Will attempt "spinning baby" maneuvers with bedside nurse. Discussed again with pt the possible need for a c-section due to failure to dilate and changes in fetal heart rate. Will continue to monitor closely.    Lance Morin, DO

## 2013-03-17 NOTE — Progress Notes (Signed)
Labor Progress Note  Patient seen, fetal heart rate and contraction pattern evaluated, patient examined.  Patient Vitals for the past 2 hrs:   BP Temp   03/17/13 1856 115/58 mmHg -   03/17/13 1812 130/56 mmHg 97.8 ??F (36.6 ??C)       Physical Exam:  Cervical Exam:  4/90 %/0/   Uterine Activity: Frequency: Every 1.5-3 minutes and Intensity: moderate  Fetal Heart Rate: Baseline: 130 per minute  Variability: moderate    Assessment/Plan:  Labor  Not progressing normally  start pitocin augmentation. Minimal cervical change since the time of admission. IUPC in place to monitor uterine contractions and FSE. Contractions are frequent but not adequate- last MVU recording was 85.  Fetal heart tracing is reassuring, but not reactive. Discussed with pt and at this time will give small amount of augmentation with Pitocin in attempts to get pt into a better contraction pattern that is more adequate. Also discussed with pt the possibility of need for c-section if still no cervical change or fetal intolerance of labor. Pt and her significant other voice understanding.     Lance Morin, DO

## 2013-03-17 NOTE — Anesthesia Procedure Notes (Signed)
Epidural Block    Staffing  Anesthesiologist: Sarah-Jane Nazario  Additional Notes  Labor Epidural catheter placement Procedure Note    Epidural analgesia requested.  All questions answered regarding epidural placement.  Patient wishes to proceed.  "Time-out" performed  Patient was placed in the seated position.   The back was prepped and draped at the lumbar region.   Lidocaine 1% x 2ml was injected locally at ~L3 - L4.  A 17G Tuohy needle was placed at the above level.   Loss of resistance was obtained, no CSF or paresthesia was apparent.   A 19 G epidural catheter was placed and secured at ~7 cm at the skin.   Aspiration was negative. No paresthesias noted.  A test dose of local anesthetic as charted  was negative for heart rate change, tinnitus, metallic taste or mental status changes.   Catheter was secured with Tegaderm and tape.   Constant infusion w/ patient controlled bolus as ordered was started by RN.  No apparent complications.  Vital signs were stable and the patient reported relief.

## 2013-03-18 LAB — CBC WITH AUTOMATED DIFF
ABS. BASOPHILS: 0 10*3/uL (ref 0.0–0.1)
ABS. EOSINOPHILS: 0 10*3/uL (ref 0.0–0.4)
ABS. LYMPHOCYTES: 0.9 10*3/uL (ref 0.8–3.5)
ABS. MONOCYTES: 0.9 10*3/uL (ref 0.0–1.0)
ABS. NEUTROPHILS: 13.1 10*3/uL — ABNORMAL HIGH (ref 1.8–8.0)
BASOPHILS: 0 % (ref 0–1)
EOSINOPHILS: 0 % (ref 0–7)
HCT: 28 % — ABNORMAL LOW (ref 35.0–47.0)
HGB: 9 g/dL — ABNORMAL LOW (ref 11.5–16.0)
LYMPHOCYTES: 6 % — ABNORMAL LOW (ref 12–49)
MCH: 25.6 PG — ABNORMAL LOW (ref 26.0–34.0)
MCHC: 32.1 g/dL (ref 30.0–36.5)
MCV: 79.5 FL — ABNORMAL LOW (ref 80.0–99.0)
MONOCYTES: 6 % (ref 5–13)
NEUTROPHILS: 88 % — ABNORMAL HIGH (ref 32–75)
PLATELET: 161 10*3/uL (ref 150–400)
RBC: 3.52 M/uL — ABNORMAL LOW (ref 3.80–5.20)
RDW: 16 % — ABNORMAL HIGH (ref 11.5–14.5)
WBC: 14.8 10*3/uL — ABNORMAL HIGH (ref 3.6–11.0)

## 2013-03-18 MED ADMIN — acetaminophen (OFIRMEV) infusion 1,000 mg: INTRAVENOUS | @ 05:00:00 | NDC 43825010201

## 2013-03-18 MED ADMIN — diphenhydrAMINE (BENADRYL) injection 12.5 mg: INTRAVENOUS | @ 09:00:00 | NDC 00641037621

## 2013-03-18 MED ADMIN — oxytocin (PITOCIN) 30 units/500 mL D5W: INTRAVENOUS | @ 01:00:00 | NDC 09999289830

## 2013-03-18 MED ADMIN — lidocaine-EPINEPHrine (PF) (XYLOCAINE) 2 %-1:200,000 injection: EPIDURAL | @ 03:00:00 | NDC 00409318301

## 2013-03-18 MED ADMIN — ondansetron (ZOFRAN) injection 4 mg: INTRAVENOUS | @ 07:00:00 | NDC 00143989105

## 2013-03-18 MED ADMIN — sodium chloride (NS) flush 5-10 mL: INTRAVENOUS | @ 03:00:00 | NDC 87701099893

## 2013-03-18 MED ADMIN — ceFAZolin in 0.9% NS (ANCEF) IVPB soln 2 g: INTRAVENOUS | @ 04:00:00 | NDC 09999966850

## 2013-03-18 MED ADMIN — ketorolac (TORADOL) injection 30 mg: INTRAVENOUS | @ 13:00:00 | NDC 00409379501

## 2013-03-18 MED ADMIN — lactated ringers infusion: INTRAVENOUS | @ 09:00:00 | NDC 00409795309

## 2013-03-18 MED ADMIN — oxytocin (PITOCIN) 30 units/500 mL D5W: INTRAVENOUS | NDC 09999289830

## 2013-03-18 MED ADMIN — ketorolac (TORADOL) injection 30 mg: INTRAVENOUS | @ 07:00:00 | NDC 00409379501

## 2013-03-18 MED ADMIN — lidocaine-EPINEPHrine (PF) (XYLOCAINE) 2 %-1:200,000 injection: @ 04:00:00 | NDC 58768034410

## 2013-03-18 MED ADMIN — ibuprofen (MOTRIN) tablet 800 mg: ORAL | @ 05:00:00 | NDC 76420057590

## 2013-03-18 MED ADMIN — oxytocin in lactated ringers 20 unit/1,000 mL infusion soln: @ 04:00:00 | NDC 53191040901

## 2013-03-18 MED ADMIN — oxytocin (PITOCIN) 20 units/1000 ml LR: INTRAVENOUS | @ 04:00:00 | NDC 99990002896

## 2013-03-18 MED ADMIN — morphine (pf) (DURAMORPH;ASTRAMORPH) 0.5 mg/mL injection: EPIDURAL | @ 04:00:00 | NDC 60977001602

## 2013-03-18 MED ADMIN — ketorolac (TORADOL) injection 30 mg: INTRAVENOUS | @ 19:00:00 | NDC 00409379501

## 2013-03-18 MED ADMIN — oxytocin (PITOCIN) 30 units/500 mL D5W: INTRAVENOUS | @ 02:00:00 | NDC 09999289830

## 2013-03-18 MED ADMIN — diphenhydrAMINE (BENADRYL) injection 12.5 mg: INTRAVENOUS | @ 13:00:00 | NDC 63323066401

## 2013-03-18 NOTE — Op Note (Signed)
Cesarean Section Operative Report       Patient: Sharon Simmons MRN: [id number]  SSN: [id number]    Date of Birth: [birth year]  Age: 22 y.o.  Sex: female       Date of Procedure: 03/17/2013     Preoperative Diagnosis: failure to progress, non reassuring FHR tracing    Postoperative Diagnosis: same as pre-op but delivered    Procedure: Low Transverse Procedure(s):  CESAREAN SECTION    Surgeon(s):  Lance Morin, DO  Assistant: RN on duty    Anesthesia: Epidural    Estimated Blood Loss :  800    Findings:   Information for the patient's newborn:  Iasia, Forcier Female A [[id number]]   Delivery of a  Female [1] infant on 03/17/2013 at 11:58 PM. Apgars were 1 and 7.    Umbilical Cord: 3 Vessels    Umbilical Cord Events: None    Placenta: Expressed removal with Intact appearance.    Amniotic Fluid Volume:  Moderate     Amniotic Fluid Description:  Clear;Blood stained         Specimens: * No specimens in log *            Complications:  none    Procedure Details:    After informed consent was obtained, the patient was taken to the operating room, where Epidural anesthesia was administered and found to be adequate. 2 gm of Ancef was given preop. Foley catheter had been placed in L&D room using sterile technique. The patient was prepped and draped in the usual sterile fashion.  After testing for adequate anesthesia, a Pfannenstiel incision was made and carried to the anterior rectus fascia which was nicked in the midline.  This incision in the fascia was extended laterally with curved Mayo Scissors.  The rectus muscles were separated from the fascial sheath with curved mayo scissors and blunt dissection.  The muscles were then parted in the midline, the peritoneum was entered bluntly and stretched.   The bladder blade was then inserted. The vesicouterine peritoneum was identified and entered sharply with Metzenbaum scissors. The bladder flap was then created sharply and the bladder blade was reinserted. A low transverse uterine  incision was made with the scalpel and extended laterally with blunt finger dissection. Amniotomy was performed. The baby???s head was then delivered atraumatically followed by the remainder of the body. The nose and mouth were suctioned. The cord was clamped and cut and the baby was handed off to the waiting  staff. The placenta was then extracted from the uterus. The uterus was exteriorized and cleared of all clots and debris using a moist lap sponge. The uterine incision was closed with a 0 Monocryl in a running locking fashion with a second layer used to imbricate the incision. Two separate figure of eight sutures were used for hemostasis. The posterior cul-de-sac was irrigated with warm normal saline.  Both lateral gutters were irrigated with warm normal saline. Due to slight oozing near the bladder flap, Arista was used for hemostasis. Good hemostasis was again reassured throughout, including the hysterotomy, bladder flap, rectus muscles and posterior surface of the fascia.  Seprafilm was placed over the hysterotomy and along the anterior aspect of the uterus. The fascia was closed with 0 Vicryl in a running fashion. Good hemostasis was assured. The subcuticular layers were reapproximated with a 3-0 Vicryl in running fashion. The skin was closed with staples. The patient tolerated the procedure well. Sponge, lap, and needle counts were correct times three and  the patient and baby were taken to recovery/postpartum room in stable condition.    Signed By: Lance Morin, DO     March 18, 2013

## 2013-03-18 NOTE — Other (Addendum)
Bedside shift change report given to R. Lape, RN (Cabin crew) by Judie Petit. Effie Shy, RN (offgoing nurse). Report included the following information SBAR, Kardex, Procedure Summary, Intake/Output, MAR and Recent Results.     Hourly rounds completed 2000-0000  Hourly rounds completed 0000-0300      0400- Bedside shift change report given to R. Lape, RN (Cabin crew) by Kirtland Bouchard. Sangpeal, RN Physiological scientist). Report included the following information SBAR, Kardex, Procedure Summary, Intake/Output, MAR and Recent Results.     Patient needs rhogam before discharge. Please order. Thank you!    Hourly rounds completed 0400-0800

## 2013-03-18 NOTE — Progress Notes (Addendum)
1230 Patient ambulating to bathroom with assistance. Steady gait. Foley removed. Due to void by 1830.    1630 patient up to bathroom with minimal assistance. Steady gait. Voided large amount without difficulty. Peri Care education complete. Due to void complete. Check void by 2230.    1845 Patient up to bathroom with standby assistance. Steady gait. Voided large amount without difficulty. Small amount of bleeding. Check void complete.

## 2013-03-18 NOTE — Progress Notes (Signed)
Post-Operative Day Number 1 Progress Note    Patient doing well post-op day 1 from cesarean delivery without significant complaints.  Pain controlled on current medication.  Voiding without difficulty, normal lochia. Denies cp/sob/n/v. No flatus yet.     Vitals:  Patient Vitals for the past 8 hrs:   BP Temp Pulse Resp SpO2   03/18/13 0430 130/73 mmHg 98.9 ??F (37.2 ??C) 75 18 -   03/18/13 0300 134/69 mmHg 98.3 ??F (36.8 ??C) 81 18 -   03/18/13 0235 133/69 mmHg 100 ??F (37.8 ??C) 101 - -   03/18/13 0220 136/61 mmHg - 100 - -   03/18/13 0205 134/71 mmHg - 110 - 97 %   03/18/13 0150 118/67 mmHg - 119 - 99 %   03/18/13 0134 134/81 mmHg - 121 - 99 %   03/18/13 0119 137/85 mmHg - 123 - -   03/18/13 0104 139/85 mmHg - 123 - 99 %   03/18/13 0050 151/71 mmHg 99.1 ??F (37.3 ??C) 143 18 99 %     Temp (24hrs), Avg:98.5 ??F (36.9 ??C), Min:97.8 ??F (36.6 ??C), Max:100 ??F (37.8 ??C)      Vital signs stable, afebrile.    Exam:  Patient without distress.   Heart regular rate and rhythm   Lungs CTA b/l               Abdomen soft, fundus firm at level of umbilicus, appropriately tender, hypoactive bowel sounds, mild distension.                 Bandage dry and clean.               Lower extremities are negative for swelling, cords or tenderness.    Lab/Data Review:  CBC:   Lab Results   Component Value Date/Time    WBC 14.8* 03/18/2013  7:00 AM    HGB 9.0* 03/18/2013  7:00 AM    HCT 28.0* 03/18/2013  7:00 AM    PLT 161 03/18/2013  7:00 AM       Assessment and Plan:  Patient appears to be having uncomplicated post-cesarean course.  Continue routine post-op care and maternal education.  Girl. Rh negative-rhogam if indicated. RI.

## 2013-03-18 NOTE — Progress Notes (Signed)
03/18/2013  10:43 AM    Patient status post Procedure(s):  CESAREAN SECTION on 03/17/2013 under epidural with duramorph.  BP 126/69   Pulse 67   Temp(Src) 37 ??C (98.6 ??F)   Resp 18   Ht 5\' 8"  (1.727 m)   Wt 88.451 kg (195 lb)   BMI 29.66 kg/m2   SpO2 97%   Breastfeeding? Yes  Patient doing relatively well.     moderate itching.  Pain is mild .  mild nausea and/or vomiting.  Urinary: foley draining  PO intake: good  Block site reveals normal exam; no erythema, swelling or tenderness.  No complications.  Continue current postop pain regimen.      Thane Edu, MD    Arlyss Gandy

## 2013-03-18 NOTE — Other (Signed)
Hourly rounds made from 0700-0800

## 2013-03-18 NOTE — Progress Notes (Signed)
Hourly rounds made from 0300-0700

## 2013-03-18 NOTE — Op Note (Signed)
Cesarean Section Operative Report       Patient: Sharon Simmons MRN: [id number]  SSN: [id number]    Date of Birth: 03/17/1991  Age: 22 y.o.  Sex: female       Date of Procedure: 03/17/2013     Preoperative Diagnosis: failure to progress, non reassuring FHR tracing    Postoperative Diagnosis: same as pre-op but delivered    Procedure: Low Transverse Procedure(s):  CESAREAN SECTION    Surgeon(s):  Orian Figueira T Golish, DO  Assistant: RN on duty    Anesthesia: Epidural    Estimated Blood Loss :  800    Findings:   Information for the patient's newborn:  Laymon, Female A [[id number]]   Delivery of a  Female [1] infant on 03/17/2013 at 11:58 PM. Apgars were 1 and 7.    Umbilical Cord: 3 Vessels    Umbilical Cord Events: None    Placenta: Expressed removal with Intact appearance.    Amniotic Fluid Volume:  Moderate     Amniotic Fluid Description:  Clear;Blood stained         Specimens: * No specimens in log *            Complications:  none    Procedure Details:    After informed consent was obtained, the patient was taken to the operating room, where Epidural anesthesia was administered and found to be adequate. 2 gm of Ancef was given preop. Foley catheter had been placed in L&D room using sterile technique. The patient was prepped and draped in the usual sterile fashion.  After testing for adequate anesthesia, a Pfannenstiel incision was made and carried to the anterior rectus fascia which was nicked in the midline.  This incision in the fascia was extended laterally with curved Mayo Scissors.  The rectus muscles were separated from the fascial sheath with curved mayo scissors and blunt dissection.  The muscles were then parted in the midline, the peritoneum was entered bluntly and stretched.   The bladder blade was then inserted. The vesicouterine peritoneum was identified and entered sharply with Metzenbaum scissors. The bladder flap was then created sharply and the bladder blade was reinserted. A low transverse uterine  incision was made with the scalpel and extended laterally with blunt finger dissection. Amniotomy was performed. The baby???s head was then delivered atraumatically followed by the remainder of the body. The nose and mouth were suctioned. The cord was clamped and cut and the baby was handed off to the waiting  staff. The placenta was then extracted from the uterus. The uterus was exteriorized and cleared of all clots and debris using a moist lap sponge. The uterine incision was closed with a 0 Monocryl in a running locking fashion with a second layer used to imbricate the incision. Two separate figure of eight sutures were used for hemostasis. The posterior cul-de-sac was irrigated with warm normal saline.  Both lateral gutters were irrigated with warm normal saline. Due to slight oozing near the bladder flap, Arista was used for hemostasis. Good hemostasis was again reassured throughout, including the hysterotomy, bladder flap, rectus muscles and posterior surface of the fascia.  Seprafilm was placed over the hysterotomy and along the anterior aspect of the uterus. The fascia was closed with 0 Vicryl in a running fashion. Good hemostasis was assured. The subcuticular layers were reapproximated with a 3-0 Vicryl in running fashion. The skin was closed with staples. The patient tolerated the procedure well. Sponge, lap, and needle counts were correct times three and   the patient and baby were taken to recovery/postpartum room in stable condition.    Signed By: Tyshauna Finkbiner T Golish, DO     March 18, 2013

## 2013-03-18 NOTE — Progress Notes (Signed)
TRANSFER - IN REPORT:    Verbal report received from carter dechristopher rn(name) on [name]  being received from labor and delivery(unit) for routine progression of care      Report consisted of patient???s Situation, Background, Assessment and   Recommendations(SBAR).     Information from the following report(s) SBAR, OR Summary, Procedure Summary, Intake/Output, MAR and Recent Results was reviewed with the receiving nurse.    Opportunity for questions and clarification was provided.      Assessment completed upon patient???s arrival to unit and care assumed.

## 2013-03-18 NOTE — Other (Addendum)
Bedside shift change report given to M. Effie Shy, RN (oncoming nurse) by I. Durwin Nora, RN Physiological scientist). Report included the following information SBAR.     0800-1200 Hourly rounds complete  1200-1600 Hourly rounds complete  1600-2000 Hourly rounds complete

## 2013-03-18 NOTE — Progress Notes (Signed)
NUTRITION       Nutrition screening referral was triggered based on results obtained during nursing admission assessment.  The patient's chart was reviewed and nutrition assessment is not indicated at this time.  Plan to see patient for rescreen as indicated.  Thank you.     EMILY L KLINE, RD

## 2013-03-18 NOTE — Anesthesia Post-Procedure Evaluation (Signed)
Post-Anesthesia Evaluation and Assessment    Patient: Sharon Simmons MRN: [id number]  SSN: [id number]    Date of Birth: 09/23/1990  Age: 21 y.o.  Sex: female       Cardiovascular Function/Vital Signs  Visit Vitals   Item Reading   ??? BP 137/85   ??? Pulse 123   ??? Temp 37.3 ??C (99.1 ??F)   ??? Resp 18   ??? Ht 5\' 8"  (1.727 m)   ??? Wt 88.451 kg (195 lb)   ??? BMI 29.66 kg/m2   ??? SpO2 99%   ??? Breastfeeding No       Patient is status post epidural anesthesia for Procedure(s):  CESAREAN SECTION.    Nausea/Vomiting: None    Postoperative hydration reviewed and adequate.    Pain:  Pain Scale 1: Labor Algorithm/Pain Intensity (03/17/13 2200)  Pain Intensity 1: 0 (03/17/13 1348)   Managed    Neurological Status:   Neuro (WDL): Within Defined Limits (03/17/13 1307)   At baseline    Mental Status and Level of Consciousness: Alert and oriented     Pulmonary Status:   O2 Device: Non-rebreather mask (Room air ) (03/17/13 1924)   Adequate oxygenation and airway patent    Complications related to anesthesia: None    Post-anesthesia assessment completed. No concerns    Signed By: Vanessa Ralphs, MD     March 18, 2013

## 2013-03-18 NOTE — Progress Notes (Addendum)
1935: Bedside shift change report given to C DeChristopher RN (oncoming nurse) by Milas Gain RN (offgoing nurse). Report included the following information SBAR, Intake/Output, MAR and Recent Results.     2000: pitocin started per Dr. Romeo Rabon    2052: SVE by Dr. Romeo Rabon 5/90/0    2057: side lying pelvic release on R side x 3 contractions    2100: side lying pelvic release on L side x3 contractions    2138: pitocin decreased d/t tachysystole    2155: pt feeling constant rectal pressure. SVE by Dr. Romeo Rabon 6/90/0. Will recheck in 1 hour    2245: pitcoin stopped d/t tachysystole and absent resting tone    2308: SVE 6-7/90/0. c/s called at this time d/t failure to progress and non reassuring fetal HR tracing    2316: Dr. Nonah Mattes at bedside to dose epidural    2334: rolled into OR 1 for c/s. Apgars were 1 & 7 - NICU called emergently for assistance at 1 minute of life.    2358: delivery of viable baby girl via LTCS.    5284: pt returned to L&D room 4 for routine post op care    0050: MEWS score of 4 noted - pt very anxious and tachycardic. MD aware - will reassess and cont to monitor    0250: SBAR OUT Report: Mother    Verbal report given to Idell Pickles RN (full name & credentials) on this patient, who is now being transferred to MIU (unit) for routine progression of care.       Report consisted of patient's Situation, Background, Assessment and Recommendations (SBAR).       Newborn ID bands were compared with the identification form, and verified with the patient and receiving nurse.    Information from the SBAR, Intake/Output, MAR and Recent Results and the [facility] Report was reviewed with the receiving nurse; opportunity for questions and clarification provided.

## 2013-03-19 MED ADMIN — oxycodone-acetaminophen (PERCOCET) 5-325 mg per tablet 1 tablet: ORAL | @ 18:00:00 | NDC 68084035511

## 2013-03-19 MED ADMIN — ibuprofen (MOTRIN) tablet 800 mg: ORAL | @ 08:00:00 | NDC 68084065811

## 2013-03-19 MED ADMIN — sodium chloride (NS) flush 5-10 mL: INTRAVENOUS | @ 02:00:00 | NDC 87701099893

## 2013-03-19 MED ADMIN — ibuprofen (MOTRIN) tablet 800 mg: ORAL | @ 18:00:00 | NDC 76420057590

## 2013-03-19 MED ADMIN — ibuprofen (MOTRIN) tablet 800 mg: ORAL | @ 16:00:00 | NDC 68084065811

## 2013-03-19 MED ADMIN — ketorolac (TORADOL) injection 30 mg: INTRAVENOUS | @ 02:00:00 | NDC 00409379501

## 2013-03-19 MED ADMIN — oxycodone-acetaminophen (PERCOCET) 5-325 mg per tablet 1 tablet: ORAL | NDC 00406051223

## 2013-03-19 NOTE — Progress Notes (Signed)
Post-Operative Day Number 2 Progress Note    Patient doing well post-op day 2 from cesarean delivery without significant complaints.  Pain controlled on current medication.  Voiding without difficulty, normal lochia. No flatus yet. Denies cp/sob/n/v.    Vitals:  Patient Vitals for the past 8 hrs:   BP Temp Pulse Resp   03/19/13 0400 134/90 mmHg 98.7 ??F (37.1 ??C) 106 18     Temp (24hrs), Avg:98.6 ??F (37 ??C), Min:98.3 ??F (36.8 ??C), Max:98.8 ??F (37.1 ??C)      Vital signs stable, afebrile.    Exam:  Patient without distress.   Heart regular rate and rhythm   Lungs CTA b/l               Abdomen soft, fundus firm at level of umbilicus, non tender.                Incision dry and clean without erythema. Staples intact               Lower extremities are negative for swelling, cords or tenderness.    Lab/Data Review:  no new labs.    Assessment and Plan:  Patient appears to be having uncomplicated post-cesarean course.  Continue routine post-op care and maternal education.  Girl. Rh negative-baby rh pos-needs rhogam. RI. Plan d/c tomorrow with follow up in office in 1-2 weeks for pp depression check.

## 2013-03-19 NOTE — Progress Notes (Addendum)
Received OB SBAR report from De Burrs, RN at the bedside.    0800-1200 Hourly rounds completed    1200-1600 Hourly rounds comleted

## 2013-03-19 NOTE — Other (Addendum)
Bedside shift change report given to R. Lape, RN (oncoming nurse) by S. Filegar, RN (offgoing nurse). Report included the following information SBAR, Kardex, Procedure Summary, Intake/Output, MAR and Recent Results.     Hourly rounds completed 2000-0000

## 2013-03-19 NOTE — Other (Addendum)
Shift report received from Britnei Stevens RN using sbar format  Hourly rounds completed 1600-2000

## 2013-03-19 NOTE — Other (Addendum)
0315: OB SBAR report received by Rachel Lape RN.   0400: OB SBAR report given to Rachel Lape RN.  Hourly rounds completed 0315-0400.

## 2013-03-20 LAB — RH IMMUNE GLOBULIN EVAL-LAB ORDER
ABO/Rh(D): O NEG
Fetal screen: NEGATIVE
Status of unit: TRANSFUSED
Unit division: 0
WEAK D: NEGATIVE

## 2013-03-20 MED ADMIN — oxycodone-acetaminophen (PERCOCET) 5-325 mg per tablet 2 tablet: ORAL | @ 05:00:00 | NDC 00406051223

## 2013-03-20 MED ADMIN — ibuprofen (MOTRIN) tablet 800 mg: ORAL | NDC 68084065811

## 2013-03-20 MED ADMIN — ondansetron (ZOFRAN ODT) tablet 4 mg: ORAL | @ 12:00:00 | NDC 68462015740

## 2013-03-20 MED ADMIN — senna (SENOKOT) tablet 8.6 mg: ORAL | @ 05:00:00 | NDC 00904516561

## 2013-03-20 MED ADMIN — ibuprofen (MOTRIN) tablet 800 mg: ORAL | @ 09:00:00 | NDC 68084065811

## 2013-03-20 MED ADMIN — oxycodone-acetaminophen (PERCOCET) 5-325 mg per tablet 2 tablet: ORAL | @ 09:00:00 | NDC 00406051223

## 2013-03-20 MED ADMIN — Rho D immune globulin (RHOGAM) 1,500 unit (300 mcg) injection 0.3 mg: INTRAMUSCULAR | NDC 00562780500

## 2013-03-20 NOTE — Discharge Summary (Signed)
Obstetrical Discharge Summary     Name: Sharon Simmons MRN: [id number]  SSN: [id number]    Date of Birth: 11-07-1990  Age: 22 y.o.  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 Rho(D) Immune Globulin - IM 03/19/2013        Discharge Condition: Good    Hospital Course: Normal hospital course following the delivery.    Patient Instructions:   Current Discharge Medication List      START taking these medications    Details   ibuprofen (MOTRIN) 800 mg tablet Take 1 tablet by mouth every eight (8) hours as needed for Pain.  Qty: 40 tablet, Refills: 1      ondansetron (ZOFRAN ODT) 4 mg disintegrating tablet Take 1 tablet by mouth every eight (8) hours as needed for Nausea.  Qty: 30 tablet, Refills: 0      oxycodone-acetaminophen (PERCOCET) 5-325 mg per tablet Take 1-2 tablets by mouth every six (6) hours as needed.  Qty: 40 tablet, Refills: 0         CONTINUE these medications which have NOT CHANGED    Details   PRENATAL VIT/IRON FUMARATE/FA (PRENATAL PO) Take  by mouth.             Reference my discharge instructions.    Follow-up Appointments   Procedures   ??? FOLLOW UP VISIT Appointment in: Other (Specify) 1-2 weeks     1-2 weeks     Standing Status: Standing      Number of Occurrences: 1      Standing Expiration Date:      Order Specific Question:   Appointment in     Answer:  Other (Specify)        Signed By:  Otho Ket, MD     March 20, 2013

## 2013-03-20 NOTE — Progress Notes (Signed)
Post-Operative Day Number 3 Progress/Discharge Note    Patient doing well post-op day 3 from cesarean delivery without significant complaints.  Pain controlled on current medication.  Voiding without difficulty, normal lochia. C/o poor pain control overnight and nausea, probably from Percocet.  Mood is improved per pt.    Vitals:  Patient Vitals for the past 8 hrs:   BP Temp Pulse Resp   03/20/13 0056 124/78 mmHg 97.4 ??F (36.3 ??C) 68 20     Temp (24hrs), Avg:98.1 ??F (36.7 ??C), Min:97.4 ??F (36.3 ??C), Max:98.4 ??F (36.9 ??C)      Vital signs stable, afebrile.    Exam:  Patient without distress.               Abdomen soft, fundus firm at level of umbilicus, non tender.     Incision dry and clean without erythema.               Lower extremities are negative for swelling, cords or tenderness.    Lab/Data Review:  No results found for this or any previous visit (from the past 12 hour(s)).      Assessment and Plan:  Patient appears to be having uncomplicated post-cesarean course.  Continue routine post-op care and maternal education.  Plan discharge for today with follow up in our office in 1-2 weeks for PP depression. Rh neg - needs Rhogam. RI/breast/girl. Discussed tips for pain control at home, wound care, PP depression precautions.

## 2013-03-20 NOTE — Progress Notes (Signed)
Kpad given to patient.

## 2013-03-20 NOTE — Other (Addendum)
Bedside shift change report given to M. Effie Shy, RN (Cabin crew) by Reece Agar. [place], Museum/gallery conservator). Report included the following information SBAR.     1150 Discharge instructions reviewed and signed. Opportunity for questions provided. Follow up in 2 weeks with OB.     0800-1300 Hourly rounds complete

## 2013-03-20 NOTE — Other (Addendum)
Bedside report given by Wilhelmina Mcardle, RN using SBAR, Kardex, MAR.  Hourly rounds done from 0000-0400.  Hourly rounds done from 0400-0800

## 2013-10-20 LAB — DRUG SCREEN, URINE
AMPHETAMINES: NEGATIVE
BARBITURATES: NEGATIVE
BENZODIAZEPINES: NEGATIVE
COCAINE: NEGATIVE
METHADONE: NEGATIVE
OPIATES: NEGATIVE
PCP(PHENCYCLIDINE): NEGATIVE
THC (TH-CANNABINOL): POSITIVE — AB

## 2013-10-20 LAB — METABOLIC PANEL, BASIC
Anion gap: 6 mmol/L (ref 5–15)
BUN/Creatinine ratio: 13 (ref 12–20)
BUN: 9 MG/DL (ref 6–20)
CO2: 25 mmol/L (ref 21–32)
Calcium: 9.3 MG/DL (ref 8.5–10.1)
Chloride: 109 mmol/L — ABNORMAL HIGH (ref 97–108)
Creatinine: 0.69 MG/DL (ref 0.45–1.15)
GFR est AA: >60 mL/min/{1.73_m2} (ref >60)
GFR est non-AA: >60 mL/min/{1.73_m2} (ref >60)
Glucose: 80 mg/dL (ref 65–100)
Potassium: 4 mmol/L (ref 3.5–5.1)
Sodium: 140 mmol/L (ref 136–145)

## 2013-10-20 LAB — URINALYSIS W/MICROSCOPIC
Bilirubin: NEGATIVE
Blood: NEGATIVE
Glucose: NEGATIVE mg/dL
Ketone: 40 mg/dL — AB
Leukocyte Esterase: NEGATIVE
Nitrites: NEGATIVE
Protein: 30 mg/dL — AB
Specific gravity: 1.03 (ref 1.003–1.030)
Urobilinogen: 0.2 EU/dL (ref 0.2–1.0)
pH (UA): 6 (ref 5.0–8.0)

## 2013-10-20 LAB — CBC WITH AUTOMATED DIFF
ABS. BASOPHILS: 0 10*3/uL (ref 0.0–0.1)
ABS. EOSINOPHILS: 0.1 10*3/uL (ref 0.0–0.4)
ABS. LYMPHOCYTES: 1.7 10*3/uL (ref 0.8–3.5)
ABS. MONOCYTES: 0.4 10*3/uL (ref 0.0–1.0)
ABS. NEUTROPHILS: 4.9 10*3/uL (ref 1.8–8.0)
BASOPHILS: 0 % (ref 0–1)
EOSINOPHILS: 1 % (ref 0–7)
HCT: 37.5 % (ref 35.0–47.0)
HGB: 11.9 g/dL (ref 11.5–16.0)
LYMPHOCYTES: 24 % (ref 12–49)
MCH: 26.6 PG (ref 26.0–34.0)
MCHC: 31.7 g/dL (ref 30.0–36.5)
MCV: 83.7 FL (ref 80.0–99.0)
MONOCYTES: 6 % (ref 5–13)
NEUTROPHILS: 69 % (ref 32–75)
PLATELET: 264 10*3/uL (ref 150–400)
RBC: 4.48 M/uL (ref 3.80–5.20)
RDW: 14.2 % (ref 11.5–14.5)
WBC: 7 10*3/uL (ref 3.6–11.0)

## 2013-10-20 LAB — HCG URINE, QL. - POC: Pregnancy test,urine (POC): NEGATIVE

## 2013-10-20 NOTE — ED Notes (Signed)
10:58 AM    DR. Kwan at bedside evaluating pt.

## 2013-10-20 NOTE — ED Notes (Signed)
Food tray ordered

## 2013-10-20 NOTE — ED Notes (Signed)
Dr. Kwan  reviewed discharge instructions with the patient.  The patient verbalized understanding.
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
## 2013-10-20 NOTE — ED Notes (Signed)
Called patient to triage. Pt arrived via EMS from home. Pt walked into triage and states "im having panic attacks and I feel suicidal" Pt refusing to elaborate on suicidal feelings or if she has a plan. Pt refusing to contract for safety in ED. States feeling suicidal since this morning. Denies hx of previous psych admits. Pt is tearful. Appears agitated and states "are you even going to help me or am I just going to sit here?" Explained to patient the procedure for [facility].  When asked if she is having homicial ideations pt states " I want to harm anything and everything" Having right knuckle pain from punching wall PTA.  Primary RN Arita MissS Byerly and Dr Elodia Florence Kwan aware of conversation
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
## 2013-10-20 NOTE — ED Notes (Signed)
[facility] at bedside. Pt. Ok to be discharged and will follow up with outpatient.  Family at bedside.

## 2014-04-26 ENCOUNTER — Inpatient Hospital Stay
Admit: 2014-04-26 | Discharge: 2014-04-26 | Disposition: A | Discharge status: Home / Self Care | Payer: MEDICAID | Attending: Emergency Medicine

## 2014-04-26 DIAGNOSIS — S0011XA Contusion of right eyelid and periocular area, initial encounter: Secondary | ICD-10-CM

## 2014-04-26 MED ORDER — IBUPROFEN 600 MG TAB
600 mg | ORAL_TABLET | Freq: Three times a day (TID) | ORAL | Status: DC | PRN
Start: 2014-04-26 — End: 2019-04-20

## 2014-04-26 MED ORDER — IBUPROFEN 600 MG TAB
600 mg | ORAL | Status: AC
Start: 2014-04-26 — End: 2014-04-26
  Administered 2014-04-26: 10:00:00 via ORAL

## 2014-04-26 MED FILL — IBUPROFEN 600 MG TAB: 600 mg | ORAL | Qty: 1

## 2014-04-26 NOTE — ED Notes (Signed)
Pt states that she twisted her ankle this morning. Pt reports hitting her head, denies LOC

## 2014-04-26 NOTE — ED Provider Notes (Signed)
HPI Comments: Sharon Simmons is a 23 y.o. female who presents ambulatory to the ED c/o progressively worsening L ankle 7/10 pain and swelling with associated abrasion and swelling to her R eyebrow s/p falling down stairs x 0400 this morning. Pt states she was arguing with her boyfriend and tried to run down a set of stairs tripped and fell forward. Pt denies any assault or abuse. Pt specifically denies any recent neck pain, back pain, LOC, CP, SOB, or abd pain.     PCP: Elvina Mattes, MD    Allergies: NKDA  PMHx: Significant for anxiety, Phillips Odor White Syndrome, anemia  PSHx: Significant for c-section, cardiac cath  Social Hx: +tobacco, -EtOH, -Illicit Drugs    There are no other complaints, changes, or physical findings at this time.   Written by Lorenda Ishihara, ED Scribe, as dictated by Judie Petit. Laurier Nancy, MD     The history is provided by the patient.        Past Medical History   Diagnosis Date   ??? Psychiatric problem      anxiety, no meds   ??? Heart abnormality      Wolff-Parkinson White Syndrome   ??? 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 Anemia         Past Surgical History   Procedure Laterality Date   ??? Hx other surgical  2012     wisdom teeth   ??? Hx heart catheterization  2012     d/t Evelene Croon Parkinson White Syndrome   ??? Hx gyn       c-section         Family History   Problem Relation Age of Onset   ??? Alcohol abuse Neg Hx    ??? Arthritis-osteo Neg Hx    ??? Asthma Neg Hx    ??? Bleeding Prob Neg Hx    ??? Cancer Neg Hx    ??? Diabetes Neg Hx    ??? Elevated Lipids Neg Hx    ??? Headache Neg Hx    ??? Heart Disease Neg Hx    ??? Hypertension Neg Hx    ??? Migraines Neg Hx    ??? Lung Disease Neg Hx    ??? Psychiatric Disorder Neg Hx    ??? Stroke Neg Hx    ??? Mental Retardation Neg Hx         History     Social History   ??? Marital Status: SINGLE     Spouse Name: N/A     Number of Children: N/A   ??? Years of Education: N/A     Occupational History   ??? Not on file.      Social History Main Topics   ??? Smoking status: Current Every Day Smoker -- 0.50 packs/day     Last Attempt to Quit: 06/13/2012   ??? Smokeless tobacco: Not on file   ??? Alcohol Use: No   ??? Drug Use: No   ??? Sexual Activity:     Partners: Male     Pharmacist, hospital Protection: Implant, None     Other Topics Concern   ??? Not on file     Social History Narrative        ALLERGIES: Review of patient's allergies indicates no known allergies.      Review of Systems   Constitutional: Negative.  Negative for fever and chills.   HENT: Negative.  Negative for congestion and sore throat.  Eyes: Negative.  Negative for discharge and redness.   Respiratory: Negative.  Negative for cough and shortness of breath.    Cardiovascular: Positive for leg swelling (R ankle). Negative for chest pain and palpitations.   Gastrointestinal: Negative.  Negative for nausea, vomiting and abdominal pain.   Endocrine: Negative.  Negative for polydipsia and polyuria.   Genitourinary: Negative.  Negative for dysuria, frequency and flank pain.   Musculoskeletal: Positive for myalgias (R ankle). Negative for back pain and arthralgias.   Skin: Positive for wound (abrasion and swelling to R eyebrow). Negative for rash.   Allergic/Immunologic: Negative.    Neurological: Negative.  Negative for dizziness and headaches.   Hematological: Negative.  Negative for adenopathy. Does not bruise/bleed easily.   Psychiatric/Behavioral: Negative.  Negative for confusion. The patient is not nervous/anxious.    All other systems reviewed and are negative.      Filed Vitals:    04/26/14 0442   BP: 105/66   Pulse: 86   Temp: 98.4 ??F (36.9 ??C)   Resp: 16   Height: 5\' 8"  (1.727 Brahim Dolman)   Weight: 59.9 kg (132 lb 0.9 oz)   SpO2: 99%            Physical Exam   Constitutional: She is oriented to person, place, and time. She appears well-developed and well-nourished. No distress.   HENT:   Head: Normocephalic. Head is with contusion.        Eyes: Conjunctivae and EOM are normal. Pupils are equal, round, and reactive to light. Right eye exhibits no discharge. Left eye exhibits no discharge.   Neck: Normal range of motion and full passive range of motion without pain. Neck supple. No JVD present. No spinous process tenderness and no muscular tenderness present. Normal range of motion present.   Cardiovascular: Normal rate, regular rhythm and intact distal pulses.    Pulmonary/Chest: Effort normal and breath sounds normal.   Abdominal: Soft. She exhibits no distension. There is no tenderness.   Musculoskeletal: She exhibits no edema.        Left foot: There is decreased range of motion, tenderness, bony tenderness and swelling. There is no crepitus and no deformity.        Feet:    Neurological: She is alert and oriented to person, place, and time. She has normal strength. No cranial nerve deficit or sensory deficit. GCS eye subscore is 4. GCS verbal subscore is 5. GCS motor subscore is 6.   Skin: Skin is warm and dry.   Psychiatric: She has a normal mood and affect. Her behavior is normal.   Nursing note and vitals reviewed.       MDM  Number of Diagnoses or Management Options  Ankle sprain, left, initial encounter:   Periorbital contusion of right eye, initial encounter:   Diagnosis management comments: DDx: sprain, strain, fracture, contusion, closed head injury    Head injury, neg CT, ankle sprain, neg xray, PCP for f/u       Amount and/or Complexity of Data Reviewed  Tests in the radiology section of CPT??: ordered and reviewed  Review and summarize past medical records: yes  Independent visualization of images, tracings, or specimens: yes    Patient Progress  Patient progress: stable      Procedures    6:00 AM    I have reviewed all pertinent and currently available diagnostic test results for this visit including, but not limited to, labs, xrays, and EKGs. I have reviewed all pertinent and currently  available medical  records. My plan of care and further evaluation and/or disposition is based on these results, as well as the initial, and subsequent, history and physical exam, as well as any additional complaints during the visit.  Haifa Hatton. Laurier Nancy, MD      IMAGING RESULTS:  CT HEAD WO CONT   Final Result   **Final Report**          ICD Codes / Adm.Diagnosis: 140001   / Ankle Pain  ankle pain   Examination:  CT HEAD WO CON  - [id number] - Apr 26 2014  5:30AM   Accession No:  [id number]   Reason:  Pain         REPORT:   INDICATION:   Headache       EXAM:  HEAD CT WITHOUT CONTRAST      COMPARISON:  None      TECHNIQUE:  Routine noncontrast axial head CT was performed. Coronal and    sagittal multiplanar reconstructions were obtained.      FINDINGS:      The ventricles and cortical sulci are within normal limits.      No evidence for acute intracranial hemorrhage, midline shift, or mass    effect. Gray-white matter differentiation is preserved.      The basal cisterns are patent.      The visualized paranasal sinuses and mastoid air cells are clear.      No calvarial abnormality.           IMPRESSION:      No acute process                   Signing/Reading Doctor: Loistine Simas [facility] [phone])     Approved: Loistine Simas [facility] ([id number])  Apr 26 2014  5:35AM                                         XR ANKLE LT MIN 3 V   Final Result   **Final Report**          ICD Codes / Adm.Diagnosis: 140001   / Ankle Pain  ankle pain   Examination:  CR ANKLE MIN 3 VWS LT  - [id number] - Apr 26 2014  5:26AM   Accession No:  [id number]   Reason:  Trauma         REPORT:   INDICATION: Trauma, left ankle pain      COMPARISON: None      FINDINGS:       3 views of the left ankle submitted for review.      No evidence for acute fracture or dislocation.           IMPRESSION:      No acute abnormality                   Signing/Reading Doctor: Loistine Simas [facility] [phone])     Approved: Enid Baas [phone])  Apr 26 2014  5:30AM                                              MEDICATIONS GIVEN:  Medications   ibuprofen (MOTRIN) tablet 600 mg (not administered)       IMPRESSION:  1. Periorbital contusion  of right eye, initial encounter    2. Ankle sprain, left, initial encounter        PLAN:  1. Rx Motrin  2. F/U with PCP in 2 days as needed  3. F/U with Ortho in 2 days as needed  Return to ED if worse     DISCHARGE NOTE:  6:03 AM  The patient's results have been reviewed with family and/or caregiver. They verbally convey their understanding and agreement of the patient's signs, symptoms, diagnosis, treatment, and prognosis. They additionally agree to follow up as recommended in the discharge instructions or to return to the Emergency Room should the patient's condition change prior to their follow-up appointment. The family and/or caregiver verbally agrees with the care-plan and all of their questions have been answered. The discharge instructions have also been provided to the them along with educational information regarding the patient's diagnosis and a list of reasons why the patient would want to return to the ER prior to their follow-up appointment should their condition change.  Written by Lorenda Ishiharaanielle K Seibert, ED Scribe, as dictated by Judie PetitM. Laurier NancyShawn Dionna Wiedemann, MD.

## 2014-04-26 NOTE — ED Notes (Signed)
MD has provided verbal and written d/c instructions. All questions answered. Pt ambulatory from ED in stable condition with papers in hand    RN provided crutch instruction. Pt demonstrated use

## 2014-06-22 DIAGNOSIS — L509 Urticaria, unspecified: Secondary | ICD-10-CM

## 2014-06-22 NOTE — ED Notes (Signed)
Discharge paper work given to patient by NP patients questions and concerns answered. Patient discharged

## 2014-06-22 NOTE — ED Provider Notes (Signed)
HPI Comments: Sharon Simmons is a 23 y.o. female who presents ambulatory to [facility] ED with CC of itching rash to BL legs and spreading to her stomach and lower back for ~30 minutes PTA; she attributes her rash to an allergic reaction. Pt denies having any allergies. She notes she ate pizza today, but states she ate the same pizza yesterday without any similar sx's. She denies taking Benadryl or steroids.  Pt denies any other new environmental exposures when asked. No recent hx of F/C, N/V/D, cough, congestion, CP, SOB, dysuria, urinary frequency/hesitancy, flank pain.       PCP: Elvina Mattes, MD    PMHx is significant for: anxiety, Wolff-Parkinson White Syndrome, Anemia  PMSx is significant for: Cardiac cath, c-section  Social: +tobacco (0.5 ppd), -EtOH, -drugs    There are no other complaints, changes, or physical findings at this time.  Written by Lacretia Nicks, ED Scribe, as dictated by Chari Manning, NP.      The history is provided by the patient.        Past Medical History:   Diagnosis Date   ??? Psychiatric problem      anxiety, no meds   ??? Heart abnormality      Wolff-Parkinson White Syndrome   ??? 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 Anemia        Past Surgical History:   Procedure Laterality Date   ??? Hx other surgical  2012     wisdom teeth   ??? Hx heart catheterization  2012     d/t Evelene Croon Parkinson White Syndrome   ??? Hx gyn       c-section         Family History:   Problem Relation Age of Onset   ??? Alcohol abuse Neg Hx    ??? Arthritis-osteo Neg Hx    ??? Asthma Neg Hx    ??? Bleeding Prob Neg Hx    ??? Cancer Neg Hx    ??? Diabetes Neg Hx    ??? Elevated Lipids Neg Hx    ??? Headache Neg Hx    ??? Heart Disease Neg Hx    ??? Hypertension Neg Hx    ??? Migraines Neg Hx    ??? Lung Disease Neg Hx    ??? Psychiatric Disorder Neg Hx    ??? Stroke Neg Hx    ??? Mental Retardation Neg Hx        History     Social History   ??? Marital Status: SINGLE     Spouse Name: N/A     Number of Children: N/A    ??? Years of Education: N/A     Occupational History   ??? Not on file.     Social History Main Topics   ??? Smoking status: Current Every Day Smoker -- 0.50 packs/day     Last Attempt to Quit: 06/13/2012   ??? Smokeless tobacco: Not on file   ??? Alcohol Use: No   ??? Drug Use: No   ??? Sexual Activity:     Partners: Male     Pharmacist, hospital Protection: Implant, None     Other Topics Concern   ??? Not on file     Social History Narrative                ALLERGIES: Review of patient's allergies indicates no known allergies.      Review of Systems   Constitutional: Negative  for fever, chills, activity change and appetite change.   HENT: Negative for congestion, rhinorrhea, sinus pressure, sneezing and sore throat.    Eyes: Negative for pain, discharge and visual disturbance.   Respiratory: Negative for cough and shortness of breath.    Cardiovascular: Negative for chest pain.   Gastrointestinal: Negative for nausea, vomiting, abdominal pain and diarrhea.   Genitourinary: Negative for dysuria, urgency, frequency and flank pain.   Musculoskeletal: Negative for myalgias, back pain, joint swelling, arthralgias, gait problem and neck pain.   Skin: Positive for rash (BL lower extremities). Negative for color change.   Allergic/Immunologic: Negative for environmental allergies, food allergies and immunocompromised state.   Neurological: Negative for dizziness, speech difficulty, weakness, light-headedness, numbness and headaches.   Psychiatric/Behavioral: Negative for behavioral problems, confusion and agitation.   All other systems reviewed and are negative.      Filed Vitals:    06/22/14 2250   BP: 118/68   Pulse: 82   Temp: 98.4 ??F (36.9 ??C)   Resp: 16   Height: 5\' 8"  (1.727 m)   Weight: 61.9 kg (136 lb 7.4 oz)   SpO2: 99%            Physical Exam   Constitutional: She is oriented to person, place, and time. She appears well-developed and well-nourished. No distress.   HENT:   Head: Normocephalic and atraumatic.    Right Ear: External ear normal.   Left Ear: External ear normal.   Nose: Nose normal.   Mouth/Throat: Oropharynx is clear and moist. No oropharyngeal exudate.   Eyes: Conjunctivae and EOM are normal. Pupils are equal, round, and reactive to light.   Neck: Normal range of motion. Neck supple.   Cardiovascular: Normal rate, regular rhythm, normal heart sounds and intact distal pulses.    Pulmonary/Chest: Effort normal and breath sounds normal. She has no wheezes.   Lungs clear   Abdominal: Soft. There is no tenderness. There is no rebound and no guarding.   Musculoskeletal: Normal range of motion.   Neurological: She is alert and oriented to person, place, and time.   Skin: Skin is warm and dry.   Scattered erythematous, urticaric, macular rash noted to BL lower extremities and extending to lumbar    Psychiatric: She has a normal mood and affect. Her behavior is normal. Judgment and thought content normal.   Nursing note and vitals reviewed.  Written by Lacretia NicksBen Burkart, ED Scribe, as dictated by Chari ManningLeslie A Jadee Golebiewski, NP.      MDM  Number of Diagnoses or Management Options  Urticaria:   Diagnosis management comments: 23 yo BF with urticaric rash to BLLE extending into lower back PTA. The causes and treatment of allergic reactions were discussed in detail. Allergen avoidance, use and side effects of OTC and prescription antihistamine decongestant products, and the steroids wer reviewed. Pt with improved s/sx at time of d/c. Discussed with pt that allergy desensitization shots are reserved for severe and refractory cases and pt was encouraged to f/u with her PCP ASAP for further concerns.          Amount and/or Complexity of Data Reviewed  Review and summarize past medical records: yes    Patient Progress  Patient progress: stable      Procedures    11:06 PM  Discussed the risks of smoking and the benefits of smoking cessation as well as the long term sequelae of smoking with the pt. The patient verbalized their understanding.    Written by Lacretia NicksBen Burkart,  ED Scribe, as dictated by Chari ManningLeslie A Anglea Gordner, NP.     MEDICATIONS GIVEN:  Medications   diphenhydrAMINE (BENADRYL) capsule 50 mg (50 mg Oral Given 06/22/14 2303)   predniSONE (DELTASONE) tablet 40 mg (40 mg Oral Given 06/22/14 2303)   famotidine (PEPCID) tablet 40 mg (40 mg Oral Given 06/22/14 2303)       IMPRESSION:  1. Urticaria        PLAN:  1. Follow up with Dr. Conni ElliotLaw ASAP   2. Rx: Pepcid, Medrol, Benadryl  3. Return to ED if worse     DISCHARGE NOTE:  11:38 PM  The patient is ready for discharge. The patient???s signs, symptoms, diagnosis, and instructions for discharge have been discussed and the pt has conveyed their understanding. The patient is to follow up as recommended with Dr. Conni ElliotLaw or return to the ER should their symptoms worsen. Plan has been discussed and patient has conveyed their agreement. Pt has been discharged with prescriptions for Pepcid, Medrol, Benadryl.  Written by Lacretia NicksBen Burkart, ED Scribe, as dictated by Chari ManningLeslie A Lennin Osmond, NP.

## 2014-06-23 ENCOUNTER — Inpatient Hospital Stay
Admit: 2014-06-23 | Discharge: 2014-06-23 | Disposition: A | Discharge status: Home / Self Care | Payer: MEDICAID | Attending: Emergency Medicine

## 2014-06-23 MED ORDER — DIPHENHYDRAMINE 50 MG CAP
50 mg | ORAL | Status: AC
Start: 2014-06-23 — End: 2014-06-22
  Administered 2014-06-23: 04:00:00 via ORAL

## 2014-06-23 MED ORDER — FAMOTIDINE 20 MG TAB
20 mg | ORAL | Status: AC
Start: 2014-06-23 — End: 2014-06-22
  Administered 2014-06-23: 04:00:00 via ORAL

## 2014-06-23 MED ORDER — PREDNISONE 20 MG TAB
20 mg | ORAL | Status: AC
Start: 2014-06-23 — End: 2014-06-22
  Administered 2014-06-23: 04:00:00 via ORAL

## 2014-06-23 MED ORDER — FAMOTIDINE 20 MG TAB
20 mg | ORAL_TABLET | Freq: Two times a day (BID) | ORAL | Status: AC
Start: 2014-06-23 — End: 2014-07-02

## 2014-06-23 MED ORDER — DIPHENHYDRAMINE 25 MG CAP
25 mg | ORAL_CAPSULE | Freq: Four times a day (QID) | ORAL | Status: AC | PRN
Start: 2014-06-23 — End: 2014-07-02

## 2014-06-23 MED ORDER — METHYLPREDNISOLONE 4 MG TABS IN A DOSE PACK
4 mg | ORAL | Status: DC
Start: 2014-06-23 — End: 2016-06-05

## 2014-06-23 MED FILL — DIPHENHYDRAMINE 50 MG CAP: 50 mg | ORAL | Qty: 1

## 2014-06-23 MED FILL — PREDNISONE 20 MG TAB: 20 mg | ORAL | Qty: 2

## 2014-06-23 MED FILL — FAMOTIDINE 20 MG TAB: 20 mg | ORAL | Qty: 2

## 2016-06-05 ENCOUNTER — Inpatient Hospital Stay
Admit: 2016-06-05 | Discharge: 2016-06-05 | Disposition: A | Discharge status: Home / Self Care | Payer: PRIVATE HEALTH INSURANCE | Attending: Emergency Medicine

## 2016-06-05 ENCOUNTER — Emergency Department: Admit: 2016-06-05 | Payer: PRIVATE HEALTH INSURANCE | Primary: Family Medicine

## 2016-06-05 DIAGNOSIS — S92412A Displaced fracture of proximal phalanx of left great toe, initial encounter for closed fracture: Secondary | ICD-10-CM

## 2016-06-05 LAB — HCG URINE, QL. - POC: Pregnancy test,urine (POC): NEGATIVE

## 2016-06-05 MED ORDER — OXYCODONE-ACETAMINOPHEN 5 MG-325 MG TAB
5-325 mg | ORAL_TABLET | Freq: Four times a day (QID) | ORAL | 0 refills | Status: DC | PRN
Start: 2016-06-05 — End: 2019-04-20

## 2016-06-05 NOTE — ED Notes (Signed)
Complaint of right great toe pain after falling down the stairs.  ON examination there is bruising but no swelling.

## 2016-06-05 NOTE — Other (Signed)
Nurse in

## 2016-06-05 NOTE — ED Notes (Signed)
PA-C Chris reviewed discharge instructions with the patient.  The patient verbalized understanding. Provided patients with post-op boot and crutches. Provided teaching instructions for use of crutches with return demonstration. Patient ambulatory out of ED with crutches.

## 2016-06-05 NOTE — ED Provider Notes (Signed)
[facility]  EMERGENCY DEPARTMENT HISTORY AND PHYSICAL EXAM         Date of Service: 06/05/2016   Patient Name: Sharon Simmons   Date of Birth: 1991/03/24  Medical Record Number: [id number]    History of Presenting Illness     Chief Complaint   Patient presents with   ??? Toe Pain     injured right great toe this morning        History Provided By:  patient    Additional History:   Sharon Simmons is a 25 y.o. female with PMhx significant for anemia, WPW syndrome, who presents ambulatory to the ED with cc of right great toe pain and swelling present since this morning. She notes that the pain increases upon ambulating and putting weight on her toes. Pt states she tripped and fell down a couple of steps, and twisted her great toe on her right foot. She denies any head trauma, LOC, or any other injuries. She denies blood thinner use. She denies chance of pregnancy but does not report any form of contraception use.         Social Hx: + Tobacco, - EtOH, - Illicit Drugs    There are no other complaints, changes or physical findings at this time.    Primary Care Provider: Elvina MattesBrett M Law, MD   Specialist:    Past History     Past Medical History:   Past Medical History:   Diagnosis Date   ??? Anemia    ??? Heart abnormality     Wolff-Parkinson White Syndrome   ??? Psychiatric problem     anxiety, no meds   ??? 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 HX GYN      c-section   ??? HX HEART CATHETERIZATION  2012    d/t Evelene CroonWolff Parkinson White Syndrome   ??? HX OTHER SURGICAL  2012    wisdom teeth        Family History:   Family History   Problem Relation Age of Onset   ??? Alcohol abuse Neg Hx    ??? Arthritis-osteo Neg Hx    ??? Asthma Neg Hx    ??? Bleeding Prob Neg Hx    ??? Cancer Neg Hx    ??? Diabetes Neg Hx    ??? Elevated Lipids Neg Hx    ??? Headache Neg Hx    ??? Heart Disease Neg Hx     ??? Hypertension Neg Hx    ??? Migraines Neg Hx    ??? Lung Disease Neg Hx    ??? Psychiatric Disorder Neg Hx    ??? Stroke Neg Hx    ??? Mental Retardation Neg Hx         Social History:   Social History   Substance Use Topics   ??? Smoking status: Current Every Day Smoker     Packs/day: 0.50     Last attempt to quit: 06/13/2012   ??? Smokeless tobacco: Never Used   ??? Alcohol use No        Allergies:   No Known Allergies     Review of Systems   Review of Systems   Constitutional: Negative for fatigue and fever.   HENT: Negative for ear pain and sore throat.    Eyes: Negative for pain, redness and visual  disturbance.   Respiratory: Negative for cough and shortness of breath.    Cardiovascular: Negative for chest pain and palpitations.   Gastrointestinal: Negative for abdominal pain, nausea and vomiting.   Genitourinary: Negative for dysuria, frequency and urgency.   Musculoskeletal: Positive for arthralgias (right big toe) and joint swelling (right big toe). Negative for back pain, gait problem, neck pain and neck stiffness.   Skin: Negative for rash and wound.   Neurological: Negative for dizziness, weakness, light-headedness, numbness and headaches.       Physical Exam  Physical Exam   Constitutional: She is oriented to person, place, and time. She appears well-developed and well-nourished. No distress.   HENT:   Head: Normocephalic and atraumatic.   Right Ear: External ear normal.   Left Ear: External ear normal.   Nose: Nose normal.   Mouth/Throat: Oropharynx is clear and moist. No oropharyngeal exudate.   Eyes: Conjunctivae and EOM are normal. Pupils are equal, round, and reactive to light. Right eye exhibits no discharge. Left eye exhibits no discharge. No scleral icterus.   Neck: Normal range of motion. Neck supple. No JVD present. No tracheal deviation present.   Cardiovascular: Normal rate, regular rhythm, normal heart sounds and intact distal pulses.  Exam reveals no gallop and no friction rub.    No murmur heard.   Pulmonary/Chest: Effort normal and breath sounds normal. No respiratory distress. She has no wheezes. She has no rales. She exhibits no tenderness.   Abdominal: Soft. Bowel sounds are normal. She exhibits no distension and no mass. There is no tenderness. There is no rebound and no guarding.   Musculoskeletal: She exhibits no edema.   RIGHT GREAT TOE:  + Tenderness at MTP joint  + Decreased active/passive ROM secondary to pain   Lymphadenopathy:     She has no cervical adenopathy.   Neurological: She is alert and oriented to person, place, and time. She has normal reflexes. No cranial nerve deficit. She exhibits normal muscle tone. Coordination normal.   NVI   Skin: Skin is warm and dry. She is not diaphoretic.   Psychiatric: She has a normal mood and affect. Her behavior is normal. Judgment and thought content normal.   Nursing note and vitals reviewed.        Medical Decision Making   I am the first provider for this patient.     I reviewed the vital signs, available nursing notes, past medical history, past surgical history, family history and social history.     Old Medical Records: Old medical records.     Provider Notes:   DDx: strain, sprain, fracture     ED Course:  3:07 PM   Initial assessment performed. The patients presenting problems have been discussed, and they are in agreement with the care plan formulated and outlined with them.  I have encouraged them to ask questions as they arise throughout their visit.    Diagnostic Study Results   Labs -      Recent Results (from the past 12 hour(s))   HCG URINE, QL. - POC    Collection Time: 06/05/16  2:53 PM   Result Value Ref Range    Pregnancy test,urine (POC) NEGATIVE  NEG         Radiologic Studies -  The following have been ordered and reviewed:  XR GREAT TOE LT MIN 2 V   Final Result   EXAM: XR GREAT TOE LT MIN 2 V  ??  INDICATION:   Trauma.  ??  COMPARISON: None.  ??  FINDINGS:  Three views of the left great toe demonstrate a corner fracture at   the base of the great toe proximal phalanx medially, with intra-articular  extension at the metatarsophalangeal joint. The fracture extends to the plantar  surface of the joint and proximal phalanx. There is approximately 2 mm  displacement of the corner fracture. No other fracture or dislocation is  obvious. There is mild associated soft tissue swelling..  ??  IMPRESSION  IMPRESSION:  Acute minimally displaced corner fracture of the right great toe  proximal phalanx.         Vital Signs-Reviewed the patient's vital signs.   Patient Vitals for the past 12 hrs:   Temp Pulse Resp BP SpO2   06/05/16 1436 98.6 ??F (37 ??C) 79 16 131/68 100 %       Medications Given in the ED:  Medications - No data to display    Diagnosis:  Clinical Impression:   1. Closed displaced fracture of proximal phalanx of right great toe, initial encounter         Plan:  1:   Follow-up Information     Follow up With Details Comments Contact Info    Lydia GuilesBrett Law, MD In 2 days As needed [address]  [place] [id number]  [phone]      Marliss CootsBarton Harris, MD   [address]  [address]  [place] [id number]  [phone]            2:   Current Discharge Medication List      START taking these medications    Details   oxyCODONE-acetaminophen (PERCOCET) 5-325 mg per tablet Take 1 Tab by mouth every six (6) hours as needed for Pain. Max Daily Amount: 4 Tabs.  Qty: 12 Tab, Refills: 0           Return to ED if worse.     Disposition:  Discharge Note:  3:31 PM  The pt is ready for discharge. The pt's signs, symptoms, diagnosis, and discharge instructions have been discussed and pt has conveyed their understanding. The pt is to follow up as recommended or return to ER should their symptoms worsen. Plan has been discussed and pt is in agreement.    This note is prepared by Gailen ShelterSalimah N Gangji, acting as a Neurosurgeoncribe for [employer]Chris Arleigh Dicola, PA-C     [employer] Gael Delude, PA-C: The scribe's documentation has been prepared  under my direction and personally reviewed by me in its entirety. I confirm that the notes above accurately reflects all work, treatment, procedures, and medical decision making performed by me.    _______________________________   Attestations:     This note is prepared by Gailen ShelterSalimah N Gangji, acting as a Neurosurgeoncribe for [employer] Sevannah Madia, PA-C     [employer] Vassie Kugel, PA-C : The scribe's documentation has been prepared under my direction and personally reviewed by me in its entirety. I confirm that the notes above accurately reflects all work, treatment, procedures, and medical decision making performed by me.  _______________________________

## 2018-01-27 ENCOUNTER — Other Ambulatory Visit: Payer: Self-pay

## 2018-01-27 ENCOUNTER — Emergency Department
Admission: EM | Admit: 2018-01-27 | Discharge: 2018-01-27 | Disposition: A | Discharge status: Home / Self Care | Payer: Medicaid - Out of State | Attending: Emergency Medicine | Admitting: Emergency Medicine

## 2018-01-27 ENCOUNTER — Emergency Department: Payer: Medicaid - Out of State

## 2018-01-27 ENCOUNTER — Encounter: Payer: Self-pay | Admitting: Emergency Medicine

## 2018-01-27 DIAGNOSIS — R1030 Lower abdominal pain, unspecified: Secondary | ICD-10-CM | POA: Diagnosis present

## 2018-01-27 DIAGNOSIS — N390 Urinary tract infection, site not specified: Secondary | ICD-10-CM | POA: Diagnosis not present

## 2018-01-27 LAB — COMPREHENSIVE METABOLIC PANEL
ALBUMIN: 4.3 g/dL (ref 3.5–5.0)
ALK PHOS: 51 U/L (ref 38–126)
ALT: 9 U/L (ref 0–44)
ANION GAP: 8 (ref 5–15)
AST: 16 U/L (ref 15–41)
BILIRUBIN TOTAL: 0.7 mg/dL (ref 0.3–1.2)
BUN: 10 mg/dL (ref 6–20)
CALCIUM: 9.1 mg/dL (ref 8.9–10.3)
CO2: 25 mmol/L (ref 22–32)
Chloride: 104 mmol/L (ref 98–111)
Creatinine, Ser: 0.83 mg/dL (ref 0.44–1.00)
GFR calc non Af Amer: >60 mL/min (ref >60)
GLUCOSE: 138 mg/dL — AB (ref 70–99)
Potassium: 4.3 mmol/L (ref 3.5–5.1)
Sodium: 137 mmol/L (ref 135–145)
Total Protein: 8.2 g/dL — ABNORMAL HIGH (ref 6.5–8.1)

## 2018-01-27 LAB — LIPASE, BLOOD: Lipase: 23 U/L (ref 11–51)

## 2018-01-27 LAB — URINALYSIS, COMPLETE (UACMP) WITH MICROSCOPIC
Bacteria, UA: NONE SEEN
Bilirubin Urine: NEGATIVE
GLUCOSE, UA: NEGATIVE mg/dL
KETONES UR: NEGATIVE mg/dL
Nitrite: NEGATIVE
PH: 5 (ref 5.0–8.0)
Protein, ur: 30 mg/dL — AB
RBC / HPF: >50 RBC/hpf — ABNORMAL HIGH (ref 0–5)
SPECIFIC GRAVITY, URINE: 1.026 (ref 1.005–1.030)

## 2018-01-27 LAB — CBC
HCT: 38.9 % (ref 35.0–47.0)
HEMOGLOBIN: 12.7 g/dL (ref 12.0–16.0)
MCH: 29 pg (ref 26.0–34.0)
MCHC: 32.6 g/dL (ref 32.0–36.0)
MCV: 88.9 fL (ref 80.0–100.0)
PLATELETS: 212 10*3/uL (ref 150–440)
RBC: 4.38 MIL/uL (ref 3.80–5.20)
RDW: 13.9 % (ref 11.5–14.5)
WBC: 14.3 10*3/uL — ABNORMAL HIGH (ref 3.6–11.0)

## 2018-01-27 LAB — POCT PREGNANCY, URINE: Preg Test, Ur: NEGATIVE

## 2018-01-27 MED ORDER — CEPHALEXIN 500 MG PO CAPS: 500.0000 mg | ORAL_CAPSULE | Freq: Two times a day (BID) | ORAL | 0 refills | Status: AC

## 2018-01-27 NOTE — ED Triage Notes (Signed)
Lower abd pain x 2 days.  

## 2018-01-27 NOTE — ED Provider Notes (Signed)
[facility] Emergency Department Provider Note   ____________________________________________    I have reviewed the triage vital signs and the nursing notes.   HISTORY  Chief Complaint Abdominal Pain     HPI Kayla Orr is a 27 y.o. female presents with complaints of abdominal pain.  Patient reports over the last 2 days she has had lower abdominal pain, yesterday she had decreased appetite.  She scrubs the pain is cramping in her left lower and right lower quadrant.  Denies burning with urination.  No fevers or chills.  Has a history of 2 C-sections.  Has not taken anything for this.  No vaginal discharge.  No back pain   History reviewed. No pertinent past medical history.  There are no active problems to display for this patient.   History reviewed. No pertinent surgical history.  Prior to Admission medications   Medication Sig Start Date End Date Taking? Authorizing Provider  cephALEXin (KEFLEX) 500 MG capsule Take 1 capsule (500 mg total) by mouth 2 (two) times daily. 01/27/18   Jene Every, MD     Allergies Patient has no known allergies.  No family history on file.  Social History Denies drug use, does not smoke  Review of Systems  Constitutional: No fever/chills Eyes: No visual changes.  ENT: No sore throat. Cardiovascular: Denies chest pain. Respiratory: Denies shortness of breath. Gastrointestinal: As above Genitourinary: Negative for dysuria.  Patient is currently menstruating Musculoskeletal: Negative for back pain. Skin: Negative for rash. Neurological: Negative for headaches    ____________________________________________   PHYSICAL EXAM:  VITAL SIGNS: ED Triage Vitals  Enc Vitals Group     BP 01/27/18 0909 101/63     Pulse Rate 01/27/18 0909 86     Resp 01/27/18 0909 20     Temp 01/27/18 0909 98.8 F (37.1 C)     Temp Source 01/27/18 0909 Oral     SpO2 01/27/18 0909 100 %     Weight 01/27/18 0910  63.5 kg (140 lb)     Height 01/27/18 0910 1.727 m (5\' 8" )     Head Circumference --      Peak Flow --      Pain Score 01/27/18 0910 9     Pain Loc --      Pain Edu? --      Excl. in GC? --     Constitutional: Alert and oriented. . Pleasant and interactive Eyes: Conjunctivae are normal.   Nose: No congestion/rhinnorhea. Mouth/Throat: Mucous membranes are moist.    Cardiovascular: Normal rate, regular rhythm. Grossly normal heart sounds.  Good peripheral circulation. Respiratory: Normal respiratory effort.  No retractions. Lungs CTAB. Gastrointestinal: Moderate tenderness to palpation left lower quadrant right lower quadrant although soft no distention.  No CVA tenderness.  Musculoskeletal: No lower extremity tenderness nor edema.  Warm and well perfused Neurologic:  Normal speech and language. No gross focal neurologic deficits are appreciated.  Skin:  Skin is warm, dry and intact. No rash noted. Psychiatric: Mood and affect are normal. Speech and behavior are normal.  ____________________________________________   LABS (all labs ordered are listed, but only abnormal results are displayed)  Labs Reviewed  COMPREHENSIVE METABOLIC PANEL - Abnormal; Notable for the following components:      Result Value   Glucose, Bld 138 (*)    Total Protein 8.2 (*)    All other components within normal limits  CBC - Abnormal; Notable for the following components:   WBC 14.3 (*)  All other components within normal limits  URINALYSIS, COMPLETE (UACMP) WITH MICROSCOPIC - Abnormal; Notable for the following components:   Color, Urine YELLOW (*)    APPearance HAZY (*)    Hgb urine dipstick LARGE (*)    Protein, ur 30 (*)    Leukocytes, UA SMALL (*)    RBC / HPF >50 (*)    All other components within normal limits  LIPASE, BLOOD  POCT PREGNANCY, URINE  POC URINE PREG, ED    ____________________________________________  EKG  None ____________________________________________  RADIOLOGY  Chest x-ray unremarkable ____________________________________________   PROCEDURES  Procedure(s) performed: No  Procedures   Critical Care performed: No ____________________________________________   INITIAL IMPRESSION / ASSESSMENT AND PLAN / ED COURSE  Pertinent labs & imaging results that were available during my care of the patient were reviewed by me and considered in my medical decision making (see chart for details).  Patient presents with lower abdominal pain left greater than right.  Differential diagnosis includes urinary tract infection, diverticulitis although unlikely at her age, constipation, menstrual pain.  Will check labs including urinalysis  Urinalysis is suspicious for UTI, labs notable for mildly elevated white blood cell count which is nonspecific.  CT scan negative for acute processes, will treat for UTI, NSAIDs with outpatient follow-up, return precautions discussed       ____________________________________________   FINAL CLINICAL IMPRESSION(S) / ED DIAGNOSES  Final diagnoses:  Lower urinary tract infectious disease        Note:  This document was prepared using Dragon voice recognition software and may include unintentional dictation errors.    Jene EveryKinner, Elayna Tobler, MD 01/27/18 1155

## 2019-04-20 ENCOUNTER — Inpatient Hospital Stay
Admit: 2019-04-20 | Discharge: 2019-04-20 | Disposition: A | Discharge status: Home / Self Care | Payer: PRIVATE HEALTH INSURANCE | Attending: Emergency Medicine

## 2019-04-20 ENCOUNTER — Emergency Department: Admit: 2019-04-20 | Payer: PRIVATE HEALTH INSURANCE | Primary: Family Medicine

## 2019-04-20 DIAGNOSIS — S93506A Unspecified sprain of unspecified lesser toe(s), initial encounter: Secondary | ICD-10-CM

## 2019-04-20 MED ORDER — IBUPROFEN 400 MG TAB
400 mg | Freq: Once | ORAL | Status: AC
Start: 2019-04-20 — End: 2019-04-20
  Administered 2019-04-20: 14:00:00 via ORAL

## 2019-04-20 MED FILL — IBUPROFEN 200 MG TAB: 200 mg | ORAL | Qty: 1

## 2019-04-20 NOTE — ED Provider Notes (Signed)
ED Provider Notes by Min-Ciearra Rufo, Darliss Cheney, MD at 04/20/19 [phone]                Author: Min-Delcie Ruppert, Darliss Cheney, MD  Service: --  Author Type: Physician       Filed: 04/21/19 0754  Date of Service: 04/20/19 0922  Status: Signed          Editor: Min-Alany Borman, Darliss Cheney, MD (Physician)               EMERGENCY DEPARTMENT HISTORY AND PHYSICAL EXAM           Date: 04/20/2019   Patient Name: Sharon Simmons        History of Presenting Illness          Chief Complaint       Patient presents with        ?  Toe Pain             patient states she was dancing last night and injured pinky toe on L foot, pt states pain is now progressing on outer later side of L foot. Pt deneis  falling, hitting head, or any LOC              HPI: Sharon Simmons,  28 y.o. female presents to the ED with cc of left pinky toe pain.  She stubbed it while  dancing yesterday and has noted increasing pain and swelling of that toe, slightly extending into the foot.  She has been ambulatory.  Denies any other injuries.               There are no other complaints, changes, or physical findings at this time.      PCP: Lydia Guiles, MD        No current facility-administered medications on file prior to encounter.           Current Outpatient Medications on File Prior to Encounter          Medication  Sig  Dispense  Refill           ?  [DISCONTINUED] oxyCODONE-acetaminophen (PERCOCET) 5-325 mg per tablet  Take 1 Tab by mouth every six (6) hours as needed for Pain. Max Daily Amount: 4 Tabs.  12 Tab  0           ?  [DISCONTINUED] ibuprofen (MOTRIN) 600 mg tablet  Take 1 Tab by mouth every eight (8) hours as needed for Pain.  30 Tab  0             Past History        Past Medical History:     Past Medical History:        Diagnosis  Date         ?  Anemia       ?  Heart abnormality            Wolff-Parkinson White Syndrome         ?  Psychiatric problem            anxiety, no meds         ?  Rhesus isoimmunization unspecified as to episode of care in  pregnancy             Past Surgical History:     Past Surgical History:         Procedure  Laterality  Date          ?  HX GYN  c-section          ?  HX HEART CATHETERIZATION    2012          d/t Evelene CroonWolff Parkinson White Syndrome          ?  HX OTHER SURGICAL    2012          wisdom teeth           Family History:     Family History         Problem  Relation  Age of Onset          ?  Alcohol abuse  Neg Hx       ?  Arthritis-osteo  Neg Hx       ?  Asthma  Neg Hx       ?  Bleeding Prob  Neg Hx       ?  Cancer  Neg Hx       ?  Diabetes  Neg Hx       ?  Elevated Lipids  Neg Hx       ?  Headache  Neg Hx       ?  Heart Disease  Neg Hx       ?  Hypertension  Neg Hx       ?  Migraines  Neg Hx       ?  Lung Disease  Neg Hx       ?  Psychiatric Disorder  Neg Hx       ?  Stroke  Neg Hx            ?  Mental Retardation  Neg Hx             Social History:     Social History          Tobacco Use         ?  Smoking status:  Current Every Day Smoker              Packs/day:  0.50         Last attempt to quit:  06/13/2012         Years since quitting:  6.8         ?  Smokeless tobacco:  Never Used       Substance Use Topics         ?  Alcohol use:  No         ?  Drug use:  No           Allergies:   No Known Allergies           Review of Systems     no fever   no eye pain   No ear pain   no shortness of breath   no chest pain   no abdominal pain        Physical Exam     Physical Exam   Constitutional :        Appearance: Normal appearance.   HENT :       Head: Normocephalic.      Mouth/Throat:      Mouth: Mucous membranes are moist.    Eyes:       Extraocular Movements: Extraocular movements intact.   Cardiovascular:       Rate and Rhythm: Normal rate and regular rhythm.   Pulmonary :       Effort: Pulmonary effort is normal.  Breath sounds: Normal breath sounds.   Abdominal :      Tenderness: There is no abdominal tenderness.     Musculoskeletal:      Comments: There is slight diffuse swelling of the left fifth toe,  diffusely tender to palpation, slight tenderness  of the fifth metatarsal, no tenderness over the midfoot or ankle, DP pulses strong, sensation to light touch intact diffusely    Skin:      General: Skin is warm and dry.   Neurological :       General: No focal deficit present.      Mental Status: She is alert and oriented to person, place, and time.    Psychiatric:         Mood and Affect: Mood normal.               Diagnostic Study Results        Labs -    No results found for this or any previous visit (from the past 24 hour(s)).      Radiologic Studies -      XR FOOT LT MIN 3 V    (Results Pending)          CT Results  (Last 48 hours)          None                 CXR Results  (Last 48 hours)          None                       Medical Decision Making     I am the first provider for this patient.      I reviewed the vital signs, available nursing notes, past medical history, past surgical history, family history and social history.      Vital Signs-Reviewed the patient's vital signs.   Patient Vitals for the past 24 hrs:            Temp  Pulse  Resp  BP  SpO2            04/20/19 0911  98.5 ??F (36.9 ??C)  81  12  114/79  100 %              Provider Notes (Medical Decision Making):    28 year old female presenting with right pinky toe pain after stubbing it.  Concerning for toe sprain versus possible fracture, given slight tenderness along the metatarsal, foot x-ray to assess for  any metatarsal injury.  Neurovascular intact, no other injuries on exam.      ED Course:       Initial assessment performed. The patients presenting problems have been discussed, and they are in agreement with the care plan formulated and outlined with them.  I have encouraged them to ask questions as they arise throughout their visit.      X-ray shows no acute osseous injury.  Patient is counseled on buddy taping, ice, elevation, NSAIDs.      Critical Care Time:             Disposition:   Home      PLAN:   1. There are no discharge  medications for this patient.      2.      Follow-up Information      None             Return to ED if worse  Diagnosis        Clinical Impression: Acute left fifth toe contusion

## 2019-04-20 NOTE — ED Notes (Signed)
Dr. Min-Venditti at bedside to provide discharge paperwork. Vital signs stable. Pt in no apparent distress at this time. Mental status at baseline. Ambulatory to waiting room with steady gate, discharge paperwork in hand. Patient and or family acknowledged and signed discharge instructions that were created/provided by the Physician. Signed discharge form with patient label placed in scan box.

## 2019-04-20 NOTE — ED Provider Notes (Signed)
EMERGENCY DEPARTMENT HISTORY AND PHYSICAL EXAM      Date: 04/20/2019  Patient Name: Sharon Simmons    History of Presenting Illness     Chief Complaint   Patient presents with   ??? Toe Pain     patient states she was dancing last night and injured pinky toe on L foot, pt states pain is now progressing on outer later side of L foot. Pt deneis falling, hitting head, or any LOC         HPI: Sharon Simmons, 28 y.o. female presents to the ED with cc of left pinky toe pain.  She stubbed it while dancing yesterday and has noted increasing pain and swelling of that toe, slightly extending into the foot.  She has been ambulatory.  Denies any other injuries.          There are no other complaints, changes, or physical findings at this time.    PCP: Sharon Guiles, MD    No current facility-administered medications on file prior to encounter.      Current Outpatient Medications on File Prior to Encounter   Medication Sig Dispense Refill   ??? [DISCONTINUED] oxyCODONE-acetaminophen (PERCOCET) 5-325 mg per tablet Take 1 Tab by mouth every six (6) hours as needed for Pain. Max Daily Amount: 4 Tabs. 12 Tab 0   ??? [DISCONTINUED] ibuprofen (MOTRIN) 600 mg tablet Take 1 Tab by mouth every eight (8) hours as needed for Pain. 30 Tab 0       Past History     Past Medical History:  Past Medical History:   Diagnosis Date   ??? Anemia    ??? Heart abnormality     Wolff-Parkinson White Syndrome   ??? Psychiatric problem     anxiety, no meds   ??? 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 HX GYN      c-section   ??? HX HEART CATHETERIZATION  2012    d/t Evelene Croon Parkinson White Syndrome   ??? HX OTHER SURGICAL  2012    wisdom teeth       Family History:  Family History   Problem Relation Age of Onset   ??? Alcohol abuse Neg Hx    ??? Arthritis-osteo Neg Hx    ??? Asthma Neg Hx    ??? Bleeding Prob Neg Hx    ??? Cancer Neg Hx    ??? Diabetes Neg Hx     ??? Elevated Lipids Neg Hx    ??? Headache Neg Hx    ??? Heart Disease Neg Hx    ??? Hypertension Neg Hx    ??? Migraines Neg Hx    ??? Lung Disease Neg Hx    ??? Psychiatric Disorder Neg Hx    ??? Stroke Neg Hx    ??? Mental Retardation Neg Hx        Social History:  Social History     Tobacco Use   ??? Smoking status: Current Every Day Smoker     Packs/day: 0.50     Last attempt to quit: 06/13/2012     Years since quitting: 6.8   ??? Smokeless tobacco: Never Used   Substance Use Topics   ??? Alcohol use: No   ??? Drug use: No       Allergies:  No Known Allergies      Review of Systems  no fever  no eye pain  No ear pain  no shortness of breath  no chest pain  no abdominal pain    Physical Exam   Physical Exam  Constitutional:       Appearance: Normal appearance.   HENT:      Head: Normocephalic.      Mouth/Throat:      Mouth: Mucous membranes are moist.   Eyes:      Extraocular Movements: Extraocular movements intact.   Cardiovascular:      Rate and Rhythm: Normal rate and regular rhythm.   Pulmonary:      Effort: Pulmonary effort is normal.      Breath sounds: Normal breath sounds.   Abdominal:      Tenderness: There is no abdominal tenderness.   Musculoskeletal:      Comments: There is slight diffuse swelling of the left fifth toe, diffusely tender to palpation, slight tenderness of the fifth metatarsal, no tenderness over the midfoot or ankle, DP pulses strong, sensation to light touch intact diffusely   Skin:     General: Skin is warm and dry.   Neurological:      General: No focal deficit present.      Mental Status: She is alert and oriented to person, place, and time.   Psychiatric:         Mood and Affect: Mood normal.         Diagnostic Study Results     Labs -   No results found for this or any previous visit (from the past 24 hour(s)).    Radiologic Studies -   XR FOOT LT MIN 3 V    (Results Pending)     CT Results  (Last 48 hours)    None        CXR Results  (Last 48 hours)    None            Medical Decision Making    I am the first provider for this patient.    I reviewed the vital signs, available nursing notes, past medical history, past surgical history, family history and social history.    Vital Signs-Reviewed the patient's vital signs.  Patient Vitals for the past 24 hrs:   Temp Pulse Resp BP SpO2   04/20/19 0911 98.5 ??F (36.9 ??C) 81 12 114/79 100 %         Provider Notes (Medical Decision Making):   28 year old female presenting with right pinky toe pain after stubbing it.  Concerning for toe sprain versus possible fracture, given slight tenderness along the metatarsal, foot x-ray to assess for any metatarsal injury.  Neurovascular intact, no other injuries on exam.    ED Course:     Initial assessment performed. The patients presenting problems have been discussed, and they are in agreement with the care plan formulated and outlined with them.  I have encouraged them to ask questions as they arise throughout their visit.    X-ray shows no acute osseous injury.  Patient is counseled on buddy taping, ice, elevation, NSAIDs.    Critical Care Time:         Disposition:  Home    PLAN:  1. There are no discharge medications for this patient.    2.   Follow-up Information    None       Return to ED if worse     Diagnosis     Clinical Impression: Acute left fifth toe contusion

## 2019-12-28 IMAGING — CT CT RENAL STONE PROTOCOL
2 of 4 series · 15 of 46 positions shown, 17 images · non-contrast
Comparison: None.

CLINICAL DATA: Lower abdominal and back pain for the past 2 days.

EXAM:
CT ABDOMEN AND PELVIS WITHOUT CONTRAST
TECHNIQUE: Multidetector CT imaging of the abdomen and pelvis was performed
following the standard protocol without IV contrast.

[Series 4: lung bases · axial · 0.72mm/px · z∈[-1038,-633]mm · 12 of 95 slices shown, 14 images]
[im 7/95  soft-tissue]
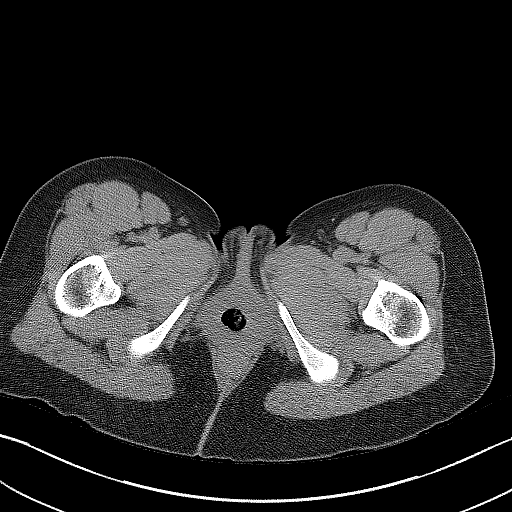
[im 7/95  bone]
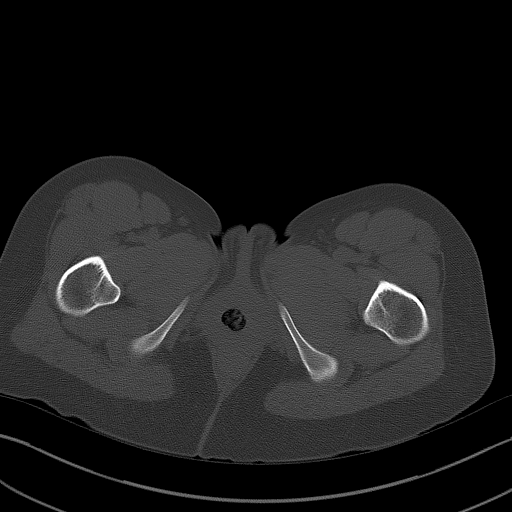
[im 13/95  soft-tissue]
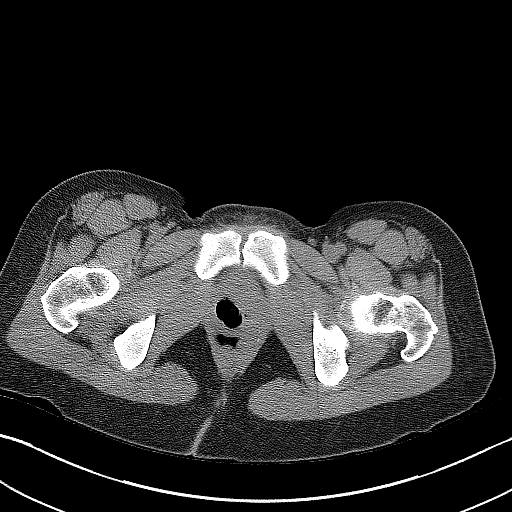
[im 19/95  soft-tissue]
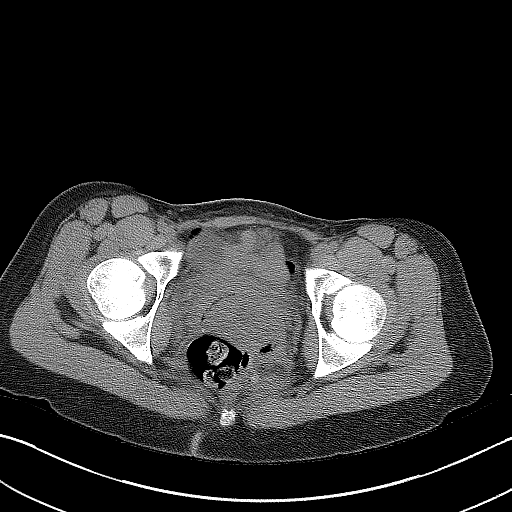
[im 32/95  soft-tissue]
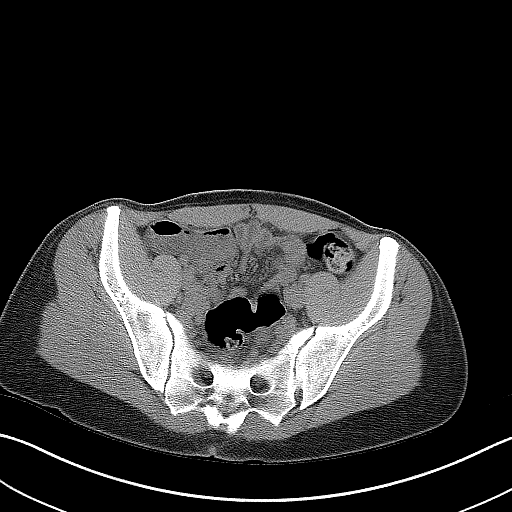
[im 38/95  soft-tissue]
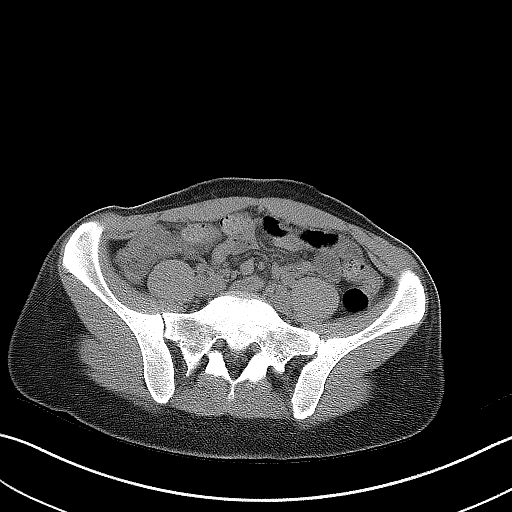
[im 44/95  soft-tissue]
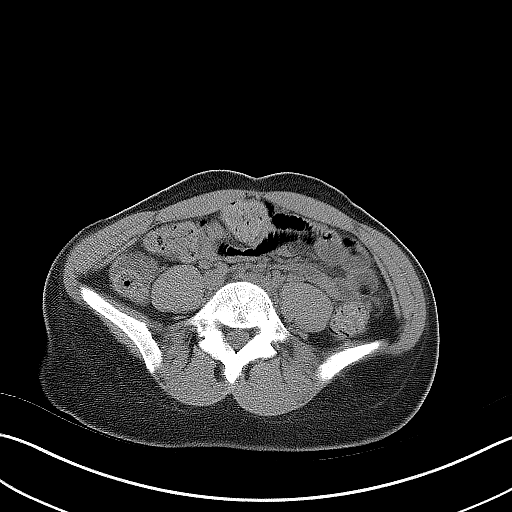
[im 51/95  soft-tissue]
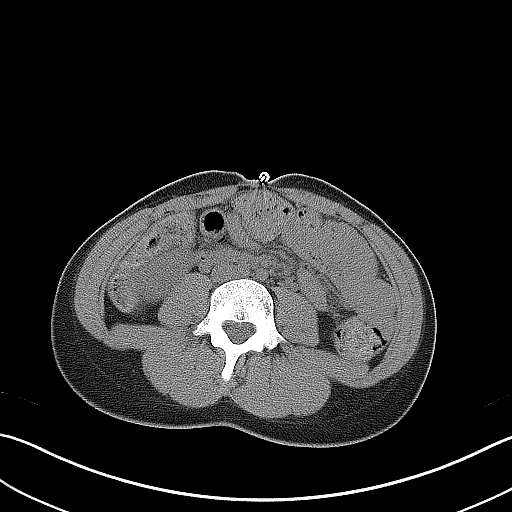
[im 57/95  soft-tissue]
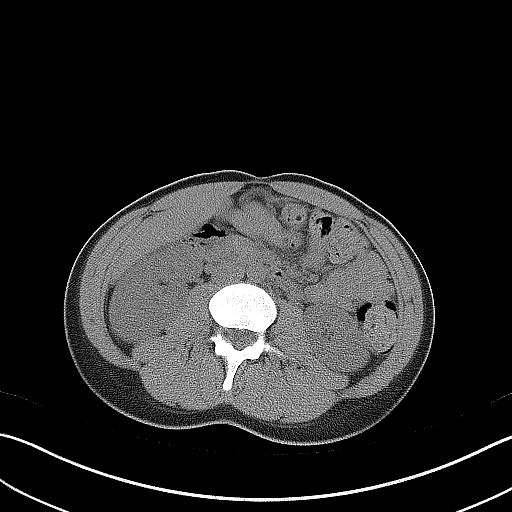
[im 63/95  soft-tissue]
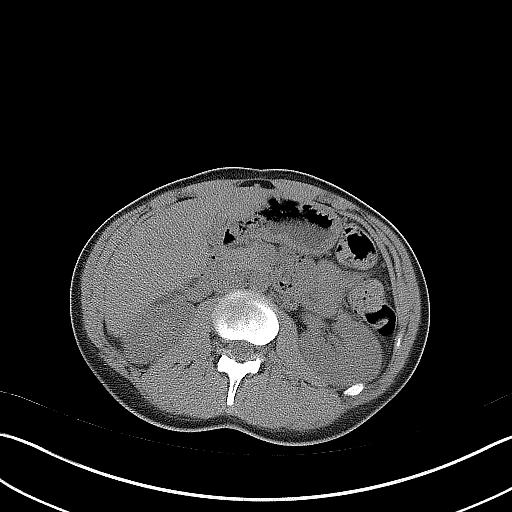
[im 63/95  bone]
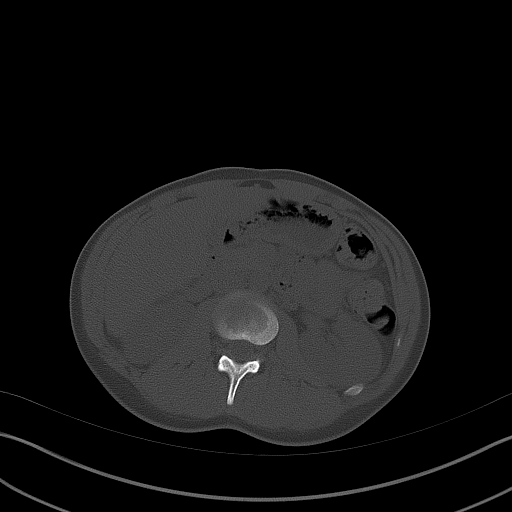
[im 76/95  soft-tissue]
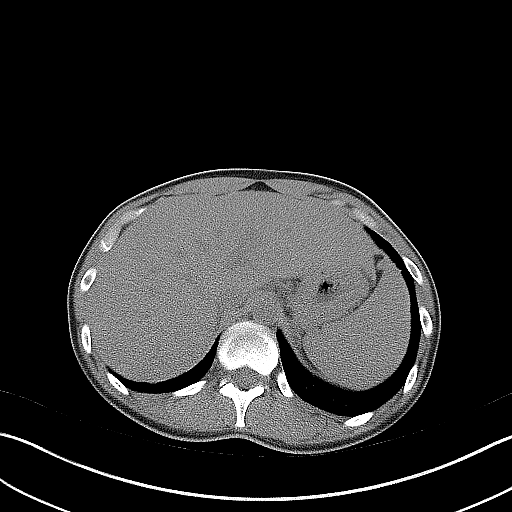
[im 82/95  soft-tissue]
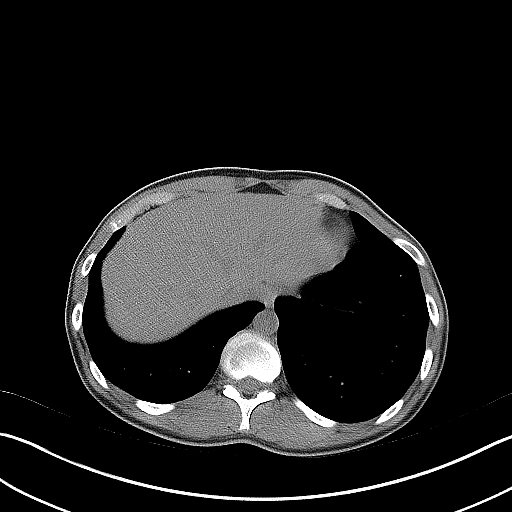
[im 88/95  soft-tissue]
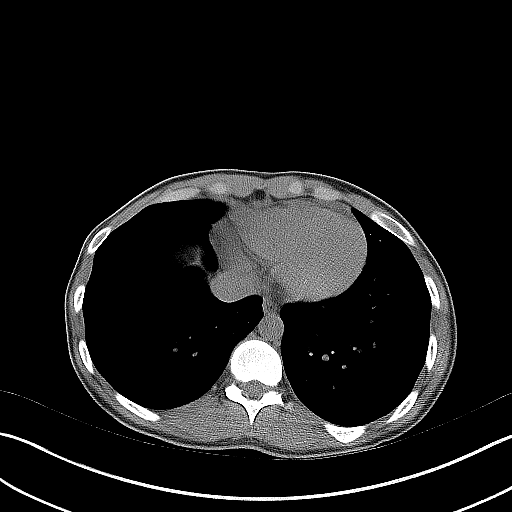

[Series 5: coronal · coronal · 0.69mm/px · 3 of 112 slices shown]
[im 38/112  soft-tissue]
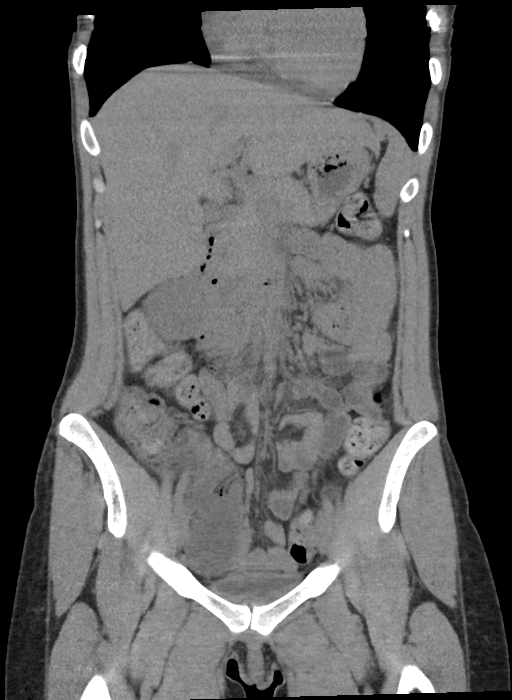
[im 50/112  soft-tissue]
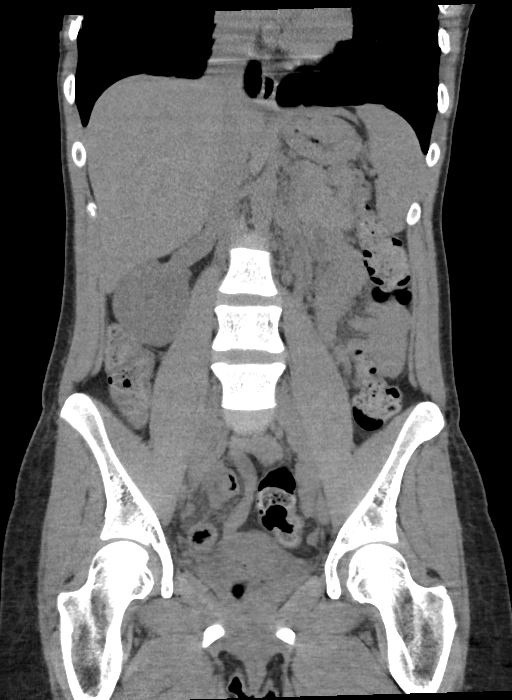
[im 62/112  soft-tissue]
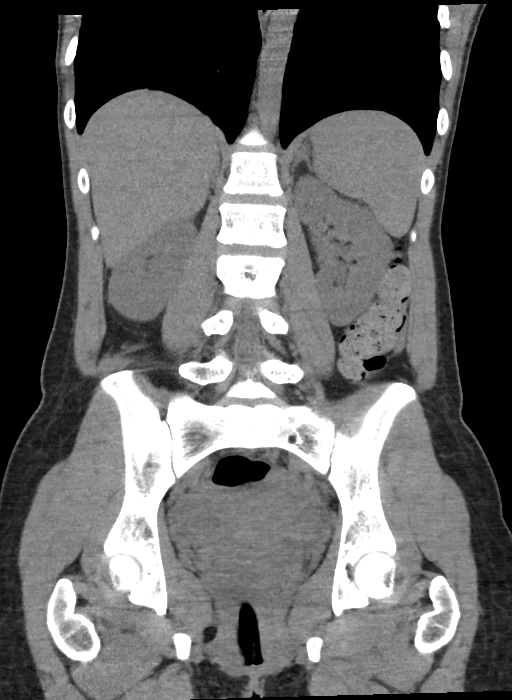

[15 of 46 positions shown; findings below may reference images not displayed]

FINDINGS: The lack of intravenous contrast limits the ability to evaluate
solid abdominal organs.

Lower chest: Limited visualization of the lower thorax is negative
for focal airspace opacity or pleural effusion.

Normal heart size.  No pericardial effusion.

Hepatobiliary: Normal hepatic contour. Note is made of partial
recanalization of the periumbilical vein. Normal noncontrast
appearance of the gallbladder given degree distention. No radiopaque
gallstones. No ascites.

Pancreas: Normal noncontrast appearance of the pancreas

Spleen: Normal noncontrast appearance the spleen

Adrenals/Urinary Tract: Normal noncontrast appearance of the
bilateral kidneys. No renal stones. No renal stones are seen along
the expected course of either ureter or the urinary bladder. Normal
noncontrast appearance of the urinary bladder given underdistention.

Stomach/Bowel: Moderate to large colonic stool burden without
evidence of enteric obstruction. Normal noncontrast appearance of
the terminal ileum. The appendix is not visualized, however there is
no definitive pericecal inflammatory change on this noncontrast
examination. No pneumoperitoneum, pneumatosis or portal venous gas.

Vascular/Lymphatic: Normal caliber of the abdominal aorta.

No definitive bulky retroperitoneal, mesenteric, pelvic or inguinal
lymphadenopathy on this noncontrast examination.

Reproductive: Tampon is seen within the vaginal vault. The uterus is
noted to be retro flexed. No discrete adnexal lesion on this
noncontrast examination. There is a small amount of presumably
physiologic free fluid in the left hemipelvis.

Other: Regional soft tissues appear normal.  Umbilical piercing.

Musculoskeletal: No acute or aggressive osseous abnormalities.
IMPRESSION: No explanation for patient's lower abdominal and back pain.
Specifically, no evidence of nephrolithiasis, urinary or enteric
obstruction on this noncontrast examination.
